# Patient Record
Sex: Male | Born: 1970 | Race: White | Hispanic: No | Marital: Married | Smoking: Never smoker
Health system: Southern US, Community
[De-identification: ages and names within clinical notes are randomized; demographics above are authoritative.]

## PROBLEM LIST (undated history)

## (undated) DIAGNOSIS — G43909 Migraine, unspecified, not intractable, without status migrainosus: Secondary | ICD-10-CM

## (undated) DIAGNOSIS — K635 Polyp of colon: Secondary | ICD-10-CM

## (undated) DIAGNOSIS — K219 Gastro-esophageal reflux disease without esophagitis: Secondary | ICD-10-CM

## (undated) DIAGNOSIS — T7840XA Allergy, unspecified, initial encounter: Secondary | ICD-10-CM

## (undated) HISTORY — DX: Allergy, unspecified, initial encounter: T78.40XA

## (undated) HISTORY — DX: Gastro-esophageal reflux disease without esophagitis: K21.9

## (undated) HISTORY — DX: Polyp of colon: K63.5

## (undated) HISTORY — DX: Migraine, unspecified, not intractable, without status migrainosus: G43.909

## (undated) HISTORY — PX: WISDOM TOOTH EXTRACTION: SHX21

## (undated) HISTORY — PX: ABDOMINAL SURGERY: SHX537

## (undated) HISTORY — PX: HERNIA REPAIR: SHX51

---

## 2003-09-16 HISTORY — PX: NASAL SINUS SURGERY: SHX719

## 2005-09-15 HISTORY — PX: NASAL SEPTUM SURGERY: SHX37

## 2005-12-05 ENCOUNTER — Ambulatory Visit: Payer: Self-pay | Admitting: Internal Medicine

## 2006-05-20 ENCOUNTER — Ambulatory Visit (HOSPITAL_COMMUNITY): Admission: RE | Admit: 2006-05-20 | Discharge: 2006-05-20 | Payer: Self-pay | Admitting: Chiropractic Medicine

## 2007-04-27 ENCOUNTER — Ambulatory Visit: Payer: Self-pay | Admitting: Internal Medicine

## 2007-05-02 ENCOUNTER — Encounter: Admission: RE | Admit: 2007-05-02 | Discharge: 2007-05-02 | Payer: Self-pay | Admitting: Internal Medicine

## 2007-05-04 ENCOUNTER — Telehealth: Payer: Self-pay | Admitting: Internal Medicine

## 2007-05-11 ENCOUNTER — Encounter: Admission: RE | Admit: 2007-05-11 | Discharge: 2007-06-11 | Payer: Self-pay | Admitting: Internal Medicine

## 2007-06-11 ENCOUNTER — Encounter: Payer: Self-pay | Admitting: Internal Medicine

## 2008-12-21 ENCOUNTER — Ambulatory Visit: Payer: Self-pay | Admitting: Sports Medicine

## 2009-03-21 ENCOUNTER — Telehealth: Payer: Self-pay | Admitting: Internal Medicine

## 2009-04-17 ENCOUNTER — Ambulatory Visit: Payer: Self-pay | Admitting: Sports Medicine

## 2009-04-23 ENCOUNTER — Ambulatory Visit: Payer: Self-pay | Admitting: Internal Medicine

## 2009-04-23 LAB — CONVERTED CEMR LAB
ALT: 18 units/L (ref 0–53)
Basophils Absolute: 0 10*3/uL (ref 0.0–0.1)
Basophils Relative: 0.1 % (ref 0.0–3.0)
Calcium: 9.8 mg/dL (ref 8.4–10.5)
Creatinine, Ser: 1.1 mg/dL (ref 0.4–1.5)
Eosinophils Absolute: 0.2 10*3/uL (ref 0.0–0.7)
GFR calc non Af Amer: 80.01 mL/min (ref >60)
HDL: 33.9 mg/dL — ABNORMAL LOW (ref >39.00)
Hemoglobin: 15.5 g/dL (ref 13.0–17.0)
Lymphocytes Relative: 25.9 % (ref 12.0–46.0)
Monocytes Relative: 11.5 % (ref 3.0–12.0)
Neutro Abs: 3.3 10*3/uL (ref 1.4–7.7)
Neutrophils Relative %: 58.5 % (ref 43.0–77.0)
Nitrite: NEGATIVE
Protein, U semiquant: NEGATIVE
RBC: 4.67 M/uL (ref 4.22–5.81)
RDW: 12.2 % (ref 11.5–14.6)
Sodium: 145 meq/L (ref 135–145)
TSH: 1.13 microintl units/mL (ref 0.35–5.50)
Total Bilirubin: 1.4 mg/dL — ABNORMAL HIGH (ref 0.3–1.2)
Total CHOL/HDL Ratio: 4
Total Protein: 7.7 g/dL (ref 6.0–8.3)
Triglycerides: 53 mg/dL (ref 0.0–149.0)
Urobilinogen, UA: 0.2
VLDL: 10.6 mg/dL (ref 0.0–40.0)
WBC Urine, dipstick: NEGATIVE

## 2009-05-07 ENCOUNTER — Telehealth: Payer: Self-pay | Admitting: Internal Medicine

## 2009-05-07 ENCOUNTER — Ambulatory Visit: Payer: Self-pay | Admitting: Internal Medicine

## 2010-03-01 ENCOUNTER — Ambulatory Visit: Payer: Self-pay | Admitting: Internal Medicine

## 2010-07-23 ENCOUNTER — Ambulatory Visit: Payer: Self-pay | Admitting: Internal Medicine

## 2010-07-23 DIAGNOSIS — N320 Bladder-neck obstruction: Secondary | ICD-10-CM | POA: Insufficient documentation

## 2010-07-23 LAB — CONVERTED CEMR LAB: PSA: 0.69 ng/mL (ref 0.10–4.00)

## 2010-10-15 NOTE — Assessment & Plan Note (Signed)
Summary: STREP THROAT / CONGESTION // RS   Vital Signs:  Patient profile:   40 year old male Weight:      153 pounds Temp:     98.7 degrees F oral BP sitting:   110 / 70  (right arm) Cuff size:   regular  Vitals Entered By: Duard Brady LPN (March 01, 2010 12:49 PM) CC: c/o sore throat , on amox from urgent care yesterday - today c/o increased chest congestion  Is Patient Diabetic? No   CC:  c/o sore throat  and on amox from urgent care yesterday - today c/o increased chest congestion .  History of Present Illness: 40 year old patient who is seen today for follow-up.  He was seen at the urgent care yesterday due to sore throat and placed on antibiotic therapy.  A rapid strep screen was apparently normal.  Today.  He developed worsening upper airway congestion and drainage and had a choking spell that was quite frightening for the present time.  He has no respiratory  distress.  Minimal cough.  He is also taken Tylenol, ibuprofen, guaifenesin, and forcing fluids.  Allergies: 1)  ! Aspirin (Aspirin)  Past History:  Past Medical History: Reviewed history from 04/12/2007 and no changes required. sinusitis, chronic  Review of Systems       The patient complains of anorexia, fever, hoarseness, and prolonged cough.  The patient denies weight loss, weight gain, vision loss, decreased hearing, chest pain, syncope, dyspnea on exertion, peripheral edema, headaches, hemoptysis, abdominal pain, melena, hematochezia, severe indigestion/heartburn, hematuria, incontinence, genital sores, muscle weakness, suspicious skin lesions, transient blindness, difficulty walking, depression, unusual weight change, abnormal bleeding, enlarged lymph nodes, angioedema, breast masses, and testicular masses.    Physical Exam  General:  appears healthy, in no acute distress.  Afebrile.  Blood pressure low normal Head:  Normocephalic and atraumatic without obvious abnormalities. No apparent alopecia or  balding. Eyes:  No corneal or conjunctival inflammation noted. EOMI. Perrla. Funduscopic exam benign, without hemorrhages, exudates or papilledema. Vision grossly normal. Ears:  External ear exam shows no significant lesions or deformities.  Otoscopic examination reveals clear canals, tympanic membranes are intact bilaterally without bulging, retraction, inflammation or discharge. Hearing is grossly normal bilaterally. Nose:  External nasal examination shows no deformity or inflammation. Nasal mucosa are pink and moist without lesions or exudates. Mouth:  pharyngeal erythema.  no significant erythematous changes or pharyngeal crowding Neck:  No deformities, masses, or tenderness noted. Lungs:  Normal respiratory effort, chest expands symmetrically. Lungs are clear to auscultation, no crackles or wheezes.  O2 saturation 98% Heart:  Normal rate and regular rhythm. S1 and S2 normal without gallop, murmur, click, rub or other extra sounds.   Impression & Recommendations:  Problem # 1:  PHARYNGITIS-ACUTE (ICD-462)  His updated medication list for this problem includes:    Amoxicillin 875 Mg Tabs (Amoxicillin) ..... Bid  Complete Medication List: 1)  Amoxicillin 875 Mg Tabs (Amoxicillin) .... Bid  Patient Instructions: 1)  Get plenty of rest, drink lots of clear liquids, and use Tylenol or Ibuprofen for fever and comfort. Return in 7-10 days if you're not better:sooner if you're feeling worse.

## 2010-10-15 NOTE — Assessment & Plan Note (Signed)
Summary: CONCERNS WITH BPH (benign prostatic hyperplasia) // RS   Vital Signs:  Patient profile:   40 year old male Weight:      148 pounds BMI:     22.58 Temp:     98.3 degrees F oral Pulse rate:   72 / minute BP sitting:   112 / 64  (left arm) Cuff size:   regular  Vitals Entered By: Alfred Levins, CMA (July 23, 2010 8:42 AM) CC: discuss BPH   CC:  discuss BPH.  History of Present Illness: Wonders about BPH---ongoing for years. patient describes a restricted urinary flow. This is especially noticeable when he has an urge to urinate. Once in a while he'll note urinary hesitancy. He does notice a poor stream. Also has nocturia 1-2 times per night. Duration has been ongoing for many years perhaps progressive in the last few months. He hasn't noted any significant change with caffeine or over-the-counter sinus medications.  No other complaints.  Current Problems (verified): None  Current Medications (verified): 1)  Flonase 50 Mcg/act Susp (Fluticasone Propionate) .... 2 Sprays Each Nostril As Needed 2)  Diflucan 100 Mg Tabs (Fluconazole) .... Once Daily One Week On and 3 Weeks Off  Allergies (verified): 1)  ! 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 HEENT exam atraumatic, normocephalic it is worth noting that he has a 2 cm swelling between his eyebrows. Neck is supple. Chest clear auscultation cardiac  exam S1-S2 are regular. Prostate exam rectal tone is normal. Prostate is flat and small without nodules or asymmetry.   Impression & Recommendations:  Problem # 1:  BLADDER OUTLET OBSTRUCTION (ICD-596.0)  patient describes bladder neck obstruction. This could be prostate related. I think it's worth trying to treat with tamsulosin. We'll start with 0.4 mg p.o. q.h.s. Side effects discussed. If his symptoms don't resolve he is to call me and at have him see urology. Check labs today.  Orders: UA Dipstick w/o Micro (automated)  (81003) Venipuncture (16109) TLB-PSA (Prostate Specific Antigen) (84153-PSA) in regards to the swelling between has eyebrows I think this is more likely some sort of allergic reaction perhaps an insect bite. He'll call me if it worsens or doesn't resolve.  Complete Medication List: 1)  Flonase 50 Mcg/act Susp (Fluticasone propionate) .... 2 sprays each nostril as needed 2)  Diflucan 100 Mg Tabs (Fluconazole) .... Once daily one week on and 3 weeks off 3)  Tamsulosin Hcl 0.4 Mg Caps (Tamsulosin hcl) .... Take 1 tab by mouth at bedtime Prescriptions: TAMSULOSIN HCL 0.4 MG CAPS (TAMSULOSIN HCL) Take 1 tab by mouth at bedtime  #30 x 11   Entered and Authorized by:   Birdie Sons MD   Signed by:   Birdie Sons MD on 07/23/2010   Method used:   Electronically to        [facility] [address] (retail)       [address]       [place], [place]  [address]  Ph: [phone]       Fax: [phone]   RxID:   [id number]    Orders Added: 1)  Est. Patient Level III [62831] 2)  UA Dipstick w/o Micro (automated)  [81003] 3)  Venipuncture [51761] 4)  TLB-PSA (Prostate Specific Antigen) [60737-TGG]  Appended Document: Orders Update    Clinical Lists Changes  Orders: Added new Service order of Specimen Handling (26948) - Signed      Appended Document: CONCERNS WITH BPH (benign prostatic hyperplasia) //  RS  Laboratory Results   Urine Tests    Routine Urinalysis   Color: yellow Appearance: Clear Glucose: negative   (Normal Range: Negative) Bilirubin: 1+   (Normal Range: Negative) Ketone: negative   (Normal Range: Negative) Spec. Gravity: >=1.030   (Normal Range: 1.003-1.035) Blood: trace-lysed   (Normal Range: Negative) pH: 5.5   (Normal Range: 5.0-8.0) Protein: negative   (Normal Range: Negative) Urobilinogen: 0.2   (Normal Range: 0-1) Nitrite: negative   (Normal Range: Negative) Leukocyte Esterace: negative   (Normal Range: Negative)    Comments: Rita Ohara  July 23, 2010 3:16 PM

## 2010-11-19 ENCOUNTER — Other Ambulatory Visit: Payer: Self-pay | Admitting: Dermatology

## 2012-11-17 ENCOUNTER — Ambulatory Visit (INDEPENDENT_AMBULATORY_CARE_PROVIDER_SITE_OTHER): Payer: Managed Care, Other (non HMO) | Admitting: Family Medicine

## 2012-11-17 VITALS — BP 106/74 | Ht 69.0 in | Wt 145.0 lb

## 2012-11-17 DIAGNOSIS — M25561 Pain in right knee: Secondary | ICD-10-CM

## 2012-11-17 DIAGNOSIS — M25569 Pain in unspecified knee: Secondary | ICD-10-CM

## 2012-11-17 MED ORDER — NITROGLYCERIN 0.1 MG/HR TD PT24: MEDICATED_PATCH | TRANSDERMAL | Status: DC

## 2012-11-17 NOTE — Patient Instructions (Addendum)
Nitroglycerin Protocol   Apply 1/4 nitroglycerin patch to affected area daily.  Change position of patch within the affected area every 24 hours.  You may experience a headache during the first 1-2 weeks of using the patch, these should subside.  If you experience headaches after beginning nitroglycerin patch treatment, you may take your preferred over the counter pain reliever.  Another side effect of the nitroglycerin patch is skin irritation or rash related to patch adhesive.  Please notify our office if you develop more severe headaches or rash, and stop the patch.  Tendon healing with nitroglycerin patch may require 12 to 24 weeks depending on the extent of injury.  Men should not use if taking Viagra, Cialis, or Levitra.   Do not use if you have migraines or rosacea.   Compression.   Quad strengthening exercises: Knee bends (70 deg) 30 times.   Try videotaping while biking to see what your knees do.   Follow-up in 4-6 weeks.

## 2012-11-17 NOTE — Assessment & Plan Note (Signed)
He may have partial tear of attachment of right VMO. Also evidence of left VMO tear although he does not have any pain in left knee.  -NTG protocol -Quad eccentric strengthening exercises -NSAIDs/OTC analgesics prn -Knee compression -Try cycling in about 2 weeks and then gradually as tolerated -Follow-up 4-6 weeks -We discussed getting MRI. Since we will treat as VMO tear, we will defer MRI at this time. If patient changes mind or if he has persistent pain at follow-up, may consider at that time.

## 2012-11-17 NOTE — Progress Notes (Signed)
  Subjective:    Patient ID: Stephen [name], male    DOB: [birth year], 42 y.o.   MRN: [id number]  HPI CC: left knee pain while cycling  This is a 41 YOM who participates in sprint triathlons. Since September, about 30-45 minutes into his cycling, he started experiencing excruciating medial superior right knee pain. The pain resolved immediately after he stopped cycling. He denies residual swelling. The pain is worse when he is at maximum flexion and just about to pedal down.  He denies pain while running.  He took a 2-3 Christoffer Currier break recently due to having the flu but the pain remained unchanged when he started cycling again.   He had his bike fitted to him and he tried changing his pedals, however, this made no difference in his symptoms.   He denies history of previous knee injuries, changes in activities/training with the onset of symptoms  Review of Systems  Per HPI  Allergies, medication, past medical history reviewed.  Smoking status noted.      Objective:   Physical Exam GEN: NAD; appears healthy and in shape GAIT: normal heel-toe pattern RIGHT KNEE:    Appearance: normal, no swelling/bruising, no VMO atrophy; quads appear normal   Tenderness: none, including medial/lateral joint line, patellar tendon   Maneuvers:     Patellar grind negative     McMurray's negative     No crepitus   Strength: 5/5 knee flexion/extension HIP: abduction/IR/ER: WNL   Symmetric leg lengths FEET: arches intacct   Limited MSK ultrasound RIGHT KNEE Quads: VMO with hypoechoic area and increased Doppler flow   Similar area of hypoechogenicity of left VMO attachment Patellar tendon intact Medial and lateral meniscus intact    Assessment & Plan:

## 2012-11-17 NOTE — Progress Notes (Deleted)
Stephen [name] is a 42 y.o. male who presents to [facility] today for right knee pain present for the last 6 months. Patient notes pain at the superior medial aspect of his right patella only with cycling. He is an avid triathlete currently doing sprint distance triathlons.  He notes pain typically occurring 30-40 minutes into bike rides at the superior medial aspect of his patella. He is pain-free with normal activities of daily living and running. He denies any swelling locking catching radiating pain weakness or numbness. He denies any change in equipment or injury. He has a proper bike set up by a bike set up by a bike fitting specialist.    PMH reviewed. Healthy History  Substance Use Topics  . Smoking status: Not on file  . Smokeless tobacco: Not on file  . 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 Normal-appearing nontender Range of motion 0-130  Stable ligamentous exam Strength intact to extension and flexion.   Pulses capillary refill and sensation are intact distally  Limited musculoskeletal ultrasound right knee: Longitudinal assessment of the VMO insertion onto the patella shows a small area of hypoechoic change within the insertion of portion of the tendon. This is associated with increased Doppler activity and seen in both longitudinal and transverse views.  This is consistent with tendinosis or small tear.

## 2012-12-02 ENCOUNTER — Ambulatory Visit: Payer: Self-pay | Admitting: Sports Medicine

## 2012-12-27 NOTE — Addendum Note (Signed)
Addended by: Annita Brod on: 12/27/2012 10:22 AM   Modules accepted: Orders

## 2012-12-29 ENCOUNTER — Other Ambulatory Visit: Payer: Managed Care, Other (non HMO)

## 2012-12-31 ENCOUNTER — Ambulatory Visit
Admission: RE | Admit: 2012-12-31 | Discharge: 2012-12-31 | Disposition: A | Discharge status: Home / Self Care | Payer: No Typology Code available for payment source | Source: Ambulatory Visit | Attending: Family Medicine | Admitting: Family Medicine

## 2012-12-31 DIAGNOSIS — M25561 Pain in right knee: Secondary | ICD-10-CM

## 2013-01-12 ENCOUNTER — Ambulatory Visit: Payer: Self-pay | Admitting: Family Medicine

## 2013-04-27 ENCOUNTER — Encounter: Payer: Self-pay | Admitting: Family Medicine

## 2013-04-27 ENCOUNTER — Ambulatory Visit (INDEPENDENT_AMBULATORY_CARE_PROVIDER_SITE_OTHER): Payer: 59 | Admitting: Family Medicine

## 2013-04-27 ENCOUNTER — Ambulatory Visit: Payer: Self-pay | Admitting: Family Medicine

## 2013-04-27 VITALS — BP 108/68 | HR 74 | Temp 98.2°F | Wt 145.0 lb

## 2013-04-27 DIAGNOSIS — J029 Acute pharyngitis, unspecified: Secondary | ICD-10-CM

## 2013-04-27 MED ORDER — AMOXICILLIN 875 MG PO TABS: 875.0000 mg | ORAL_TABLET | Freq: Two times a day (BID) | ORAL | Status: DC

## 2013-04-27 NOTE — Patient Instructions (Addendum)
Strep Throat  Strep throat is an infection of the throat caused by a bacteria named Streptococcus pyogenes. Your caregiver may call the infection streptococcal "tonsillitis" or "pharyngitis" depending on whether there are signs of inflammation in the tonsils or back of the throat. Strep throat is most common in children aged 41 15 years during the cold months of the year, but it can occur in people of any age during any season. This infection is spread from person to person (contagious) through coughing, sneezing, or other close contact.  SYMPTOMS   · Fever or chills.  · Painful, swollen, red tonsils or throat.  · Pain or difficulty when swallowing.  · White or yellow spots on the tonsils or throat.  · Swollen, tender lymph nodes or "glands" of the neck or under the jaw.  · Red rash all over the body (rare).  DIAGNOSIS   Many different infections can cause the same symptoms. A test must be done to confirm the diagnosis so the right treatment can be given. A "rapid strep test" can help your caregiver make the diagnosis in a few minutes. If this test is not available, a light swab of the infected area can be used for a throat culture test. If a throat culture test is done, results are usually available in a day or two.  TREATMENT   Strep throat is treated with antibiotic medicine.  HOME CARE INSTRUCTIONS   · Gargle with 1 tsp of salt in 1 cup of warm water, 3 4 times per day or as needed for comfort.  · Family members who also have a sore throat or fever should be tested for strep throat and treated with antibiotics if they have the strep infection.  · Make sure everyone in your household washes their hands well.  · Do not share food, drinking cups, or personal items that could cause the infection to spread to others.  · You may need to eat a soft food diet until your sore throat gets better.  · Drink enough water and fluids to keep your urine clear or pale yellow. This will help prevent dehydration.  · Get plenty of  rest.  · Stay home from school, daycare, or work until you have been on antibiotics for 24 hours.  · Only take over-the-counter or prescription medicines for pain, discomfort, or fever as directed by your caregiver.  · If antibiotics are prescribed, take them as directed. Finish them even if you start to feel better.  SEEK MEDICAL CARE IF:   · The glands in your neck continue to enlarge.  · You develop a rash, cough, or earache.  · You cough up green, yellow-brown, or bloody sputum.  · You have pain or discomfort not controlled by medicines.  · Your problems seem to be getting worse rather than better.  SEEK IMMEDIATE MEDICAL CARE IF:   · You develop any new symptoms such as vomiting, severe headache, stiff or painful neck, chest pain, shortness of breath, or trouble swallowing.  · You develop severe throat pain, drooling, or changes in your voice.  · You develop swelling of the neck, or the skin on the neck becomes red and tender.  · You have a fever.  · You develop signs of dehydration, such as fatigue, dry mouth, and decreased urination.  · You become increasingly sleepy, or you cannot wake up completely.  Document Released: 08/29/2000 Document Revised: 08/18/2012 Document Reviewed: 10/31/2010  ExitCare® Patient Information ©2014 [employer].

## 2013-04-27 NOTE — Progress Notes (Signed)
  Subjective:    Patient ID: Stephen [name], male    DOB: 05-28-1971, 42 y.o.   MRN: [id number]  HPI Acute visit Three-day history of sore throat.  He's had some chills and night sweats and possible fever Denies any nasal congestion or cough. Advil has provided some basic relief. He's had some increased malaise and body aches No sick contacts but he has traveled some recently Symptoms are actually somewhat improved today. He did notice some exudate right tonsillar region and right side is worse than left  No past medical history on file. No past surgical history on file.  reports that he has never smoked. He does not have any smokeless tobacco history on file. He reports that  drinks alcohol. He reports that he does not use illicit drugs. family history is not on file. Allergies  Allergen Reactions  . Aspirin     REACTION: hives      Review of Systems  Constitutional: Positive for chills and fatigue.  HENT: Positive for sore throat. Negative for ear pain, congestion and postnasal drip.   Respiratory: Negative for cough.   Hematological: Negative for adenopathy.       Objective:   Physical Exam  Constitutional: He appears well-developed and well-nourished.  HENT:  Right Ear: External ear normal.  Left Ear: External ear normal.  Posterior pharynx reveals erythema right side greater than left. He has some whitish exudate right tonsillar region. No evidence for peritonsillar abscess  Neck: Neck supple.  Cardiovascular: Normal rate and regular rhythm.   Pulmonary/Chest: Effort normal and breath sounds normal. No respiratory distress. He has no wheezes. He has no rales.  Lymphadenopathy:    He has no cervical adenopathy.          Assessment & Plan:  Acute pharyngitis. Rule out group A strep. Rapid strep obtained Rapid strep negative. Given lack of nasal congestion and cough and typical viral symptoms go ahead and cover with amoxicillin 875 mg twice daily for 10 days. We  discussed pros and cons of throat culture and have decided to wait

## 2013-06-28 ENCOUNTER — Ambulatory Visit: Payer: 59

## 2013-07-08 ENCOUNTER — Ambulatory Visit (INDEPENDENT_AMBULATORY_CARE_PROVIDER_SITE_OTHER): Payer: 59

## 2013-07-08 DIAGNOSIS — Z23 Encounter for immunization: Secondary | ICD-10-CM

## 2013-09-15 HISTORY — PX: UMBILICAL HERNIA REPAIR: SHX196

## 2013-09-15 HISTORY — PX: EXPLORATORY LAPAROTOMY: SUR591

## 2013-09-15 HISTORY — PX: ABDOMINAL SURGERY: SHX537

## 2014-01-03 ENCOUNTER — Ambulatory Visit (INDEPENDENT_AMBULATORY_CARE_PROVIDER_SITE_OTHER): Payer: Managed Care, Other (non HMO) | Admitting: Sports Medicine

## 2014-01-03 ENCOUNTER — Encounter: Payer: Self-pay | Admitting: Sports Medicine

## 2014-01-03 VITALS — BP 111/75 | HR 63 | Ht 69.0 in | Wt 145.0 lb

## 2014-01-03 DIAGNOSIS — M67912 Unspecified disorder of synovium and tendon, left shoulder: Secondary | ICD-10-CM

## 2014-01-03 DIAGNOSIS — M6790 Unspecified disorder of synovium and tendon, unspecified site: Secondary | ICD-10-CM

## 2014-01-03 DIAGNOSIS — M719 Bursopathy, unspecified: Secondary | ICD-10-CM

## 2014-01-03 MED ORDER — NITROGLYCERIN 0.2 MG/HR TD PT24: MEDICATED_PATCH | TRANSDERMAL | Status: DC

## 2014-01-03 NOTE — Patient Instructions (Signed)
Please start applying 1/4 of a nitroglycerin patch to left shoulder daily  Nitroglycerin Protocol   Apply 1/4 nitroglycerin patch to affected area daily.  Change position of patch within the affected area every 24 hours.  You may experience a headache during the first 1-2 weeks of using the patch, these should subside.  If you experience headaches after beginning nitroglycerin patch treatment, you may take your preferred over the counter pain reliever.  Another side effect of the nitroglycerin patch is skin irritation or rash related to patch adhesive.  Please notify our office if you develop more severe headaches or rash, and stop the patch.  Tendon healing with nitroglycerin patch may require 12 to 24 weeks depending on the extent of injury.  Men should not use if taking Viagra, Cialis, or Levitra.   Do not use if you have migraines or rosacea.   Please do suggested exercises for your shoulder daily  Follow up in 4 weeks  Thank you for seeing [place] today!

## 2014-01-03 NOTE — Assessment & Plan Note (Signed)
Given standard rotator cuff rehabilitation for home exercise program  Okay to continue lifting and other planes of motion that do not cause pain  Nitroglycerin protocol  Recheck 4-6 weeks

## 2014-01-03 NOTE — Progress Notes (Signed)
Patient ID: Stephen [name], male   DOB: 08/13/1971, 43 y.o.   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 Palpation is normal with no tenderness over AC joint or bicipital groove. ROM is full in all planes. Rotator cuff strength normal throughout with exception of supraspinatous weakness on the left Positive signs of impingement with  Neer and Hawkin's tests, empty can. Speeds and Yergason's tests normal. Very slight pain on speeds Equivocal labral pathology noted with  Obrien's feeling weak on both palm up and palm down position  negative clunk and good stability. Normal scapular function observed. No painful arc and no drop arm sign. No apprehension sign  MSK ultrasound Normal biceps tendon Normal subscapularis infraspinatus and teres minor tendons In the supraspinatous only in the posterior third of fibers there is a 0.5 cm hypoechoic gap  Normal a.c. Joint Posterior labrum looks normal

## 2014-02-01 ENCOUNTER — Ambulatory Visit: Payer: Managed Care, Other (non HMO) | Admitting: Sports Medicine

## 2014-02-21 ENCOUNTER — Ambulatory Visit (INDEPENDENT_AMBULATORY_CARE_PROVIDER_SITE_OTHER): Payer: BC Managed Care – PPO | Admitting: Sports Medicine

## 2014-02-21 ENCOUNTER — Encounter: Payer: Self-pay | Admitting: Sports Medicine

## 2014-02-21 VITALS — BP 99/62 | Ht 69.0 in | Wt 147.0 lb

## 2014-02-21 DIAGNOSIS — M67912 Unspecified disorder of synovium and tendon, left shoulder: Secondary | ICD-10-CM

## 2014-02-21 DIAGNOSIS — M6790 Unspecified disorder of synovium and tendon, unspecified site: Secondary | ICD-10-CM

## 2014-02-21 DIAGNOSIS — M719 Bursopathy, unspecified: Secondary | ICD-10-CM

## 2014-02-21 MED ORDER — NITROGLYCERIN 0.2 MG/HR TD PT24: MEDICATED_PATCH | TRANSDERMAL | Status: DC

## 2014-02-21 NOTE — Assessment & Plan Note (Signed)
Keep up NTG  Keep working rehab   Build up to 5 lbs  Reck 6 wks and will hopefully be ready to stop NTG

## 2014-02-21 NOTE — Patient Instructions (Signed)
I will refill your nitroglycerin. Continue exercises. Do not go above 5 lbs. Gradually increase the weight and positions for strengthening. You can go to half-pushups and advance as tolerated. Follow up with Dr Oneida Alar in 6 weeks for re-evaluation. You can swim and do other exercises as pain allows. Maximum pain should be no greater than 3/10.

## 2014-02-21 NOTE — Progress Notes (Signed)
Patient ID: Stephen [name], male   DOB: 01-03-1971, 43 y.o.   MRN: [id number]  Patient w left shoulder pain ER of shoulder while yawning and sleeping Felt pop and pain on anterior capsule  Now on NTG and HEP Able to do more exercises and using 3 lb now No more night pain  No new sxs  Comes for reck  NAD BP 99/62  Ht 5\' 9"  (1.753 m)  Wt 147 lb (66.679 kg)  BMI 21.70 kg/m2 Left Shoulder: Inspection reveals no abnormalities, atrophy or asymmetry. Palpation is normal with no tenderness over AC joint or bicipital groove. ROM is full in all planes. Rotator cuff strength normal throughout. No signs of impingement with negative Neer and Hawkin's tests, empty can. Speeds and Yergason's tests normal. No labral pathology noted with negative Obrien's, negative clunk and good stability. Normal scapular function observed. No painful arc and no drop arm sign. No apprehension sign  Clunk test was neg  Gets pain on elevated IR resistance SLT pain over biceps with speeds but no palpable TTP

## 2014-04-04 ENCOUNTER — Ambulatory Visit (INDEPENDENT_AMBULATORY_CARE_PROVIDER_SITE_OTHER): Payer: BC Managed Care – PPO | Admitting: Sports Medicine

## 2014-04-04 ENCOUNTER — Encounter: Payer: Self-pay | Admitting: Sports Medicine

## 2014-04-04 VITALS — BP 116/72 | HR 63 | Ht 69.0 in | Wt 147.0 lb

## 2014-04-04 DIAGNOSIS — M719 Bursopathy, unspecified: Secondary | ICD-10-CM

## 2014-04-04 DIAGNOSIS — M67912 Unspecified disorder of synovium and tendon, left shoulder: Secondary | ICD-10-CM

## 2014-04-04 DIAGNOSIS — M6790 Unspecified disorder of synovium and tendon, unspecified site: Secondary | ICD-10-CM

## 2014-04-04 NOTE — Progress Notes (Signed)
Patient ID: Stephen [name], male   DOB: 22-May-1971, 43 y.o.   MRN: [id number]  Stephen [name] returns for followup of left rotator cuff tendinopathy He has used the nitroglycerin protocol for 3 months He was much better at his 6 week visit Now he states he rarely has any pain. He is able to do pushups again. He has no night pain. Pain level usually is 0 and does not go above 3.  He didn't last long including swimming this last weekend with no pain. He has been very consistent with a home exercise program.  Physical exam Muscular male in no acute distress BP 116/72  Pulse 63  Ht 5\' 9"  (1.753 m)  Wt 147 lb (66.679 kg)  BMI 21.70 kg/m2   Shoulder: Inspection reveals no abnormalities, atrophy or asymmetry. Palpation is normal with no tenderness over AC joint or bicipital groove. ROM is full in all planes. Rotator cuff strength normal throughout. No signs of impingement with negative Neer and Hawkin's tests, empty can. Speeds and Yergason's tests normal. No labral pathology noted with negative Obrien's, negative clunk and good stability. Normal scapular function observed. No painful arc and no drop arm sign. No apprehension sign  Provocative tests with full resistance do not cause any pain  MSK ultrasound Bicipital tendon is normal Subscapularis infraspinatus and teres minor are normal Supraspinatous shows that the small half centimeter tear has healed and there is only a minimal amount of hypoechoic change within the tendon itself

## 2014-04-04 NOTE — Assessment & Plan Note (Signed)
Clinically this has resolved On ultrasound his tear has resolved and there is only a minimal amount of hypoechoic change within the supraspinatous tendon  He will continue home exercises over the next month while he increases back to his normal training schedule Wean off nitroglycerin  Return when necessary

## 2014-05-25 ENCOUNTER — Emergency Department (HOSPITAL_COMMUNITY): Payer: BLUE CROSS/BLUE SHIELD

## 2014-05-25 ENCOUNTER — Observation Stay (HOSPITAL_COMMUNITY)
Admission: EM | Admit: 2014-05-25 | Discharge: 2014-05-26 | Disposition: A | Discharge status: Home / Self Care | Payer: BLUE CROSS/BLUE SHIELD | Attending: Surgery | Admitting: Surgery

## 2014-05-25 ENCOUNTER — Encounter (HOSPITAL_COMMUNITY): Payer: Self-pay | Admitting: Emergency Medicine

## 2014-05-25 DIAGNOSIS — R109 Unspecified abdominal pain: Secondary | ICD-10-CM | POA: Diagnosis present

## 2014-05-25 DIAGNOSIS — Z9889 Other specified postprocedural states: Secondary | ICD-10-CM | POA: Insufficient documentation

## 2014-05-25 DIAGNOSIS — R1013 Epigastric pain: Principal | ICD-10-CM | POA: Insufficient documentation

## 2014-05-25 LAB — URINALYSIS, ROUTINE W REFLEX MICROSCOPIC
BILIRUBIN URINE: NEGATIVE
Glucose, UA: NEGATIVE mg/dL
Hgb urine dipstick: NEGATIVE
KETONES UR: NEGATIVE mg/dL
Leukocytes, UA: NEGATIVE
NITRITE: NEGATIVE
PH: 7.5 (ref 5.0–8.0)
PROTEIN: NEGATIVE mg/dL
Specific Gravity, Urine: 1.008 (ref 1.005–1.030)
Urobilinogen, UA: 0.2 mg/dL (ref 0.0–1.0)

## 2014-05-25 LAB — LIPASE, BLOOD: LIPASE: 65 U/L — AB (ref 11–59)

## 2014-05-25 LAB — CBC WITH DIFFERENTIAL/PLATELET
BASOS ABS: 0 10*3/uL (ref 0.0–0.1)
Basophils Relative: 0 % (ref 0–1)
EOS PCT: 1 % (ref 0–5)
Eosinophils Absolute: 0.1 10*3/uL (ref 0.0–0.7)
HEMATOCRIT: 44.2 % (ref 39.0–52.0)
HEMOGLOBIN: 15.5 g/dL (ref 13.0–17.0)
LYMPHS ABS: 1.2 10*3/uL (ref 0.7–4.0)
LYMPHS PCT: 10 % — AB (ref 12–46)
MCH: 31.8 pg (ref 26.0–34.0)
MCHC: 35.1 g/dL (ref 30.0–36.0)
MCV: 90.8 fL (ref 78.0–100.0)
MONO ABS: 0.9 10*3/uL (ref 0.1–1.0)
MONOS PCT: 8 % (ref 3–12)
NEUTROS ABS: 9.9 10*3/uL — AB (ref 1.7–7.7)
Neutrophils Relative %: 81 % — ABNORMAL HIGH (ref 43–77)
Platelets: 247 10*3/uL (ref 150–400)
RBC: 4.87 MIL/uL (ref 4.22–5.81)
RDW: 12.5 % (ref 11.5–15.5)
WBC: 12 10*3/uL — AB (ref 4.0–10.5)

## 2014-05-25 LAB — COMPREHENSIVE METABOLIC PANEL
ALT: 18 U/L (ref 0–53)
AST: 21 U/L (ref 0–37)
Albumin: 4.6 g/dL (ref 3.5–5.2)
Alkaline Phosphatase: 37 U/L — ABNORMAL LOW (ref 39–117)
Anion gap: 16 — ABNORMAL HIGH (ref 5–15)
BILIRUBIN TOTAL: 0.7 mg/dL (ref 0.3–1.2)
BUN: 16 mg/dL (ref 6–23)
CALCIUM: 10.2 mg/dL (ref 8.4–10.5)
CHLORIDE: 100 meq/L (ref 96–112)
CO2: 25 meq/L (ref 19–32)
CREATININE: 1 mg/dL (ref 0.50–1.35)
GLUCOSE: 109 mg/dL — AB (ref 70–99)
Potassium: 3.7 mEq/L (ref 3.7–5.3)
Sodium: 141 mEq/L (ref 137–147)
Total Protein: 7.2 g/dL (ref 6.0–8.3)

## 2014-05-25 MED ORDER — ENOXAPARIN SODIUM 40 MG/0.4ML ~~LOC~~ SOLN
40.0000 mg | SUBCUTANEOUS | Status: DC
  Administered 2014-05-25: 40 mg via SUBCUTANEOUS
  Filled 2014-05-25 (×2): qty 0.4

## 2014-05-25 MED ORDER — DIPHENHYDRAMINE HCL 50 MG/ML IJ SOLN
50.0000 mg | Freq: Once | INTRAMUSCULAR | Status: AC
  Administered 2014-05-25: 50 mg via INTRAVENOUS
  Filled 2014-05-25: qty 1

## 2014-05-25 MED ORDER — MORPHINE SULFATE 4 MG/ML IJ SOLN
4.0000 mg | INTRAMUSCULAR | Status: DC | PRN
  Administered 2014-05-25 (×2): 4 mg via INTRAVENOUS
  Filled 2014-05-25 (×2): qty 1

## 2014-05-25 MED ORDER — ZOLPIDEM TARTRATE 5 MG PO TABS: 5.0000 mg | ORAL_TABLET | Freq: Every evening | ORAL | Status: DC | PRN

## 2014-05-25 MED ORDER — ONDANSETRON HCL 4 MG/2ML IJ SOLN: 4.0000 mg | Freq: Four times a day (QID) | INTRAMUSCULAR | Status: DC | PRN

## 2014-05-25 MED ORDER — DEXTROSE-NACL 5-0.9 % IV SOLN
INTRAVENOUS | Status: DC
  Administered 2014-05-25 – 2014-05-26 (×5): via INTRAVENOUS

## 2014-05-25 MED ORDER — SODIUM CHLORIDE 0.9 % IV BOLUS (SEPSIS)
1000.0000 mL | Freq: Once | INTRAVENOUS | Status: AC
  Administered 2014-05-25: 1000 mL via INTRAVENOUS

## 2014-05-25 MED ORDER — PANTOPRAZOLE SODIUM 40 MG IV SOLR
40.0000 mg | Freq: Every day | INTRAVENOUS | Status: DC
  Administered 2014-05-25: 40 mg via INTRAVENOUS
  Filled 2014-05-25 (×2): qty 40

## 2014-05-25 MED ORDER — IOHEXOL 300 MG/ML  SOLN
80.0000 mL | Freq: Once | INTRAMUSCULAR | Status: AC | PRN
  Administered 2014-05-25: 80 mL via INTRAVENOUS

## 2014-05-25 MED ORDER — MORPHINE SULFATE 2 MG/ML IJ SOLN: 2.0000 mg | INTRAMUSCULAR | Status: DC | PRN

## 2014-05-25 MED ORDER — ONDANSETRON HCL 4 MG/2ML IJ SOLN
4.0000 mg | Freq: Once | INTRAMUSCULAR | Status: AC
  Administered 2014-05-25: 4 mg via INTRAVENOUS
  Filled 2014-05-25: qty 2

## 2014-05-25 NOTE — ED Provider Notes (Signed)
Medical screening examination/treatment/procedure(s) were conducted as a shared visit with non-physician practitioner(s) and myself.  I personally evaluated the patient during the encounter.   EKG Interpretation   Date/Time:  Thursday May 25 2014 13:11:39 EDT Ventricular Rate:  65 PR Interval:  168 QRS Duration: 98 QT Interval:  413 QTC Calculation: 429 R Axis:   61 Text Interpretation:  Sinus rhythm RSR' in V1 or V2, probably normal  variant ST elev, probable normal early repol pattern No old tracing to  compare Confirmed by [facility]  MD, TREY ([id number]) on 05/26/2014 7:50:06 PM      43 yo male with abdominal pain.  Recent laparotomy for SBO.  On exam, well appearing, nontoxic, not distressed, normal respiratory effort, normal perfusion, abdomen soft with mild right sided tenderness without r/r/g.  Abdominal surgical scar healing well.  CT shows inflammatory stranding around jejunum.  Surgery will admit.    Clinical Impression: 1. Epigastric pain       Houston Siren III, MD 05/26/14 1950

## 2014-05-25 NOTE — H&P (Signed)
Chief Complaint:  Abdominal pain HPI: Stephen [name] is a healthy 43 year old male who was vacationing in  2.5 weeks ago who developed abdominal pain.  He was found to have an internal hernia causing an obstruction.  He underwent an exploratory laparotomy with LOA, hernia repair.  He reports having a normal post operative course is being discharged home.  He reports since his surgery having episodes of severe abdominal pain when he eats anything other than chicken noodle soup.  After having chicken, rice and vegetables last night he developed severe abdominal pain in the middle of the night once again.  He has been taking motrin and tylenol, vicodin at times, but did not have any relief in his symptoms.  He denies nausea or vomiting.  Denies fever, chills or sweats.  He reports intermittent GERD like symptoms and takes a prilosec OTC. Aggravated by food.  No alleviating factors.  Modifying factors as above.  Pain is characterized as dull, burning type pain.  He has normal BMs, no melena or hematochezia.  His work up shows a white count of 12K, normal electrolytes.   A CT of abdomen and pelvis shows small bowel wall thickening with stranding, fecalization of small bowel and small volume free fluid within the pelvis.  We have therefore been asked to evaluate the patient.    History reviewed. No pertinent past medical history.  Past Surgical History  Procedure Laterality Date  . Abdominal surgery    . Hernia repair      History reviewed. No pertinent family history. Social History:  reports that he has never smoked. He does not have any smokeless tobacco history on file. He reports that he drinks alcohol. He reports that he does not use illicit drugs.  Allergies:  Allergies  Allergen Reactions  . Aspirin Hives   Medication prilosec OTC daily  (Not in a hospital admission)  Results for orders placed during the hospital encounter of 05/25/14 (from the past 48 hour(s))  CBC WITH DIFFERENTIAL      Status: Abnormal   Collection Time    05/25/14 12:30 PM      Result Value Ref Range   WBC 12.0 (*) 4.0 - 10.5 K/uL   RBC 4.87  4.22 - 5.81 MIL/uL   Hemoglobin 15.5  13.0 - 17.0 g/dL   HCT 44.2  39.0 - 52.0 %   MCV 90.8  78.0 - 100.0 fL   MCH 31.8  26.0 - 34.0 pg   MCHC 35.1  30.0 - 36.0 g/dL   RDW 12.5  11.5 - 15.5 %   Platelets 247  150 - 400 K/uL   Neutrophils Relative % 81 (*) 43 - 77 %   Neutro Abs 9.9 (*) 1.7 - 7.7 K/uL   Lymphocytes Relative 10 (*) 12 - 46 %   Lymphs Abs 1.2  0.7 - 4.0 K/uL   Monocytes Relative 8  3 - 12 %   Monocytes Absolute 0.9  0.1 - 1.0 K/uL   Eosinophils Relative 1  0 - 5 %   Eosinophils Absolute 0.1  0.0 - 0.7 K/uL   Basophils Relative 0  0 - 1 %   Basophils Absolute 0.0  0.0 - 0.1 K/uL  COMPREHENSIVE METABOLIC PANEL     Status: Abnormal   Collection Time    05/25/14 12:30 PM      Result Value Ref Range   Sodium 141  137 - 147 mEq/L   Potassium 3.7  3.7 - 5.3 mEq/L  Chloride 100  96 - 112 mEq/L   CO2 25  19 - 32 mEq/L   Glucose, Bld 109 (*) 70 - 99 mg/dL   BUN 16  6 - 23 mg/dL   Creatinine, Ser 1.00  0.50 - 1.35 mg/dL   Calcium 10.2  8.4 - 10.5 mg/dL   Total Protein 7.2  6.0 - 8.3 g/dL   Albumin 4.6  3.5 - 5.2 g/dL   AST 21  0 - 37 U/L   ALT 18  0 - 53 U/L   Alkaline Phosphatase 37 (*) 39 - 117 U/L   Total Bilirubin 0.7  0.3 - 1.2 mg/dL   GFR calc non Af Amer >90  >90 mL/min   GFR calc Af Amer >90  >90 mL/min   Comment: (NOTE)     The eGFR has been calculated using the CKD EPI equation.     This calculation has not been validated in all clinical situations.     eGFR's persistently <90 mL/min signify possible Chronic Kidney     Disease.   Anion gap 16 (*) 5 - 15  LIPASE, BLOOD     Status: Abnormal   Collection Time    05/25/14 12:30 PM      Result Value Ref Range   Lipase 65 (*) 11 - 59 U/L  URINALYSIS, ROUTINE W REFLEX MICROSCOPIC     Status: None   Collection Time    05/25/14  2:15 PM      Result Value Ref Range   Color, Urine  YELLOW  YELLOW   APPearance CLEAR  CLEAR   Specific Gravity, Urine 1.008  1.005 - 1.030   pH 7.5  5.0 - 8.0   Glucose, UA NEGATIVE  NEGATIVE mg/dL   Hgb urine dipstick NEGATIVE  NEGATIVE   Bilirubin Urine NEGATIVE  NEGATIVE   Ketones, ur NEGATIVE  NEGATIVE mg/dL   Protein, ur NEGATIVE  NEGATIVE mg/dL   Urobilinogen, UA 0.2  0.0 - 1.0 mg/dL   Nitrite NEGATIVE  NEGATIVE   Leukocytes, UA NEGATIVE  NEGATIVE   Comment: MICROSCOPIC NOT DONE ON URINES WITH NEGATIVE PROTEIN, BLOOD, LEUKOCYTES, NITRITE, OR GLUCOSE <1000 mg/dL.   Ct Abdomen Pelvis W Contrast  05/25/2014   CLINICAL DATA:  Abdominal pain, recent bowel obstruction due to hernia.  EXAM: CT ABDOMEN AND PELVIS WITH CONTRAST  TECHNIQUE: Multidetector CT imaging of the abdomen and pelvis was performed using the standard protocol following bolus administration of intravenous contrast.  CONTRAST:  70m OMNIPAQUE IOHEXOL 300 MG/ML  SOLN  COMPARISON:  None available.  FINDINGS: Mild subsegmental atelectasis seen dependently within the visualized lung bases. No pleural or pericardial effusion.  Single 5 mm hypodensity within the right hepatic lobe is too small the characterize by CT, but may represent a small cyst. The liver is otherwise unremarkable. Gallbladder is within normal limits. No biliary ductal dilatation. The spleen, adrenal glands, and pancreas demonstrate a normal contrast enhanced appearance.  The kidneys are equal in size with symmetric enhancement. No nephrolithiasis, hydronephrosis, or focal enhancing renal mass.  Stomach within normal limits with enteric contrast material and ingested contents present within the gastric lumen. Circumferential wall thickening with mild inflammatory stranding seen about several prominent loops of jejunum in the left and central abdomen (series 204, image 26 on coronal sequence). Small amount of free fluid also present within the adjacent left hemi abdomen. Fecalization of enteric contents seen within this  loop of bowel, suggesting slow transit. Few mildly prominent loops of small bowel  measuring up to 3 cm in diameter are present within the right hemi abdomen just proximal to this (series 201, image 36). Distally, the ileum is within normal limits. The colon is of normal caliber and appearance without acute inflammatory changes. Appendix is within normal limits.  Bladder is within normal limits.  Prostate are unremarkable.  Small volume free fluid present within the pelvis (series 201, image 69), which may be related to active inflammation or possibly recent surgery.  No free intraperitoneal air.  No pathologically enlarged intra-abdominal or pelvic lymph nodes are identified. Normal intravascular enhancement seen throughout the abdomen and pelvis.  No acute osseous abnormality. No worrisome lytic or blastic osseous lesions.  IMPRESSION: 1. Circumferential wall thickening with inflammatory stranding about several prominent loops of jejunum in the left and central abdomen. While this finding may be in part related to recent surgery, acute enteritis could also have this appearance. Fecalization of enteric contents within this loop of bowel with a few mildly prominent loops of bowel proximally suggest slow transit. No overt evidence of bowel obstruction at this time. 2. Small volume free fluid within the pelvis and left hemi abdomen, which may be related to active inflammation or possibly recent surgery.   Electronically Signed   By: Jeannine Boga M.D.   On: 05/25/2014 15:17    Review of Systems  All other systems reviewed and are negative.   Blood pressure 112/66, pulse 79, temperature 98 F (36.7 C), temperature source Oral, resp. rate 24, SpO2 100.00%. Physical Exam  Constitutional: He is oriented to person, place, and time. He appears well-developed and well-nourished. No distress.  Neck: Normal range of motion. Neck supple.  Cardiovascular: Normal rate, regular rhythm, normal heart sounds and  intact distal pulses.  Exam reveals no gallop and no friction rub.   No murmur heard. Respiratory: Effort normal and breath sounds normal. No respiratory distress. He has no wheezes. He has no rales. He exhibits no tenderness.  GI: Soft. Bowel sounds are normal. He exhibits no distension and no mass. There is no rebound and no guarding.  Mild diffuse tenderness, most prominent to Rt mid abdomen.  Healing midline incision.   Musculoskeletal: Normal range of motion. He exhibits no edema and no tenderness.  Lymphadenopathy:    He has no cervical adenopathy.  Neurological: He is alert and oriented to person, place, and time.  Skin: Skin is warm and dry. He is not diaphoretic.  Psychiatric: He has a normal mood and affect. His behavior is normal. Judgment normal.     Assessment/Plan S/p diagnostic laparoscopy with conversion to laparotomy, LOA and reduction of internal hernia on 05/08/14([place], [place]) Jejunal inflammation and stranding Small bowel fecalization Small amount of free fluid within the pelvis and left hemi abdomen  -Admit for IV hydration -NPO x ice chips, NGT if N/V or worsening abdominal pain -Repeat AXR in AM -Repeat labs in AM -If no improvement in symptoms, will consider a UGI with SBFT to evaluate for a stricture -PPI     Ansley Mangiapane ANP-BC 05/25/2014, 4:03 PM

## 2014-05-25 NOTE — ED Provider Notes (Signed)
CSN: [id number]     Arrival date & time 05/25/14  1153 History   First MD Initiated Contact with Patient 05/25/14 1220     Chief Complaint  Patient presents with  . Abdominal Pain     (Consider location/radiation/quality/duration/timing/severity/associated sxs/prior Treatment) HPI  Deion Swift is a 43 y.o. male complaining of acute onset of epigastric abdominal pain described as colicky and severe, woke him from sleep at 2 AM. Patient had surgery for small bowel obstruction abnormality 2 weeks ago while he was traveling to [facility]. Records show patient had an internal hernia with mesenteric ischemia, laparotomy with lysis of adhesions. Patient states that he has been recovering well, taking Motrin and acetaminophen for relief. Patient denies fever, chills, nausea, vomiting, change in bowel or bladder habits, he has been passing flatus normally. There is no melena or hematochezia. States that this pain feels like when he had a bowel obstruction several weeks ago.  History reviewed. No pertinent past medical history. Past Surgical History  Procedure Laterality Date  . Abdominal surgery    . Hernia repair     History reviewed. No pertinent family history. History  Substance Use Topics  . Smoking status: Never Smoker   . Smokeless tobacco: Not on file  . Alcohol Use: Yes     Comment: 1  a day    Review of Systems  10 systems reviewed and found to be negative, except as noted in the HPI.   Allergies  Aspirin  Home Medications   Prior to Admission medications   Medication Sig Start Date End Date Taking? Authorizing Provider  finasteride (PROSCAR) 5 MG tablet Take 1.25 mg by mouth 3 (three) times a week. On Monday, Wednesday and Friday.   Yes Historical Provider, MD  fluticasone (FLONASE) 50 MCG/ACT nasal spray Place 2 sprays into both nostrils daily.   Yes Historical Provider, MD  Melatonin 3 MG CAPS Take 3 mg by mouth at bedtime.   Yes Historical Provider, MD  omeprazole  (PRILOSEC OTC) 20 MG tablet Take 20 mg by mouth daily.   Yes Historical Provider, MD   BP 106/75  Pulse 78  Temp(Src) 98 F (36.7 C) (Oral)  Resp 19  SpO2 100% Physical Exam  Nursing note and vitals reviewed. Constitutional: He is oriented to person, place, and time. He appears well-developed and well-nourished. No distress.  HENT:  Head: Normocephalic.  Eyes: Conjunctivae and EOM are normal.  Cardiovascular: Normal rate.   Pulmonary/Chest: Effort normal and breath sounds normal. No stridor.  Abdominal: Soft. Bowel sounds are normal. He exhibits no distension and no mass. There is tenderness. There is no rebound and no guarding.  Well-healing lower midline surgical incision. Clean dry and intact. No warmth, discharge or induration.   Diffusely tender to palpation with no focal tenderness, guarding or rebound.  Musculoskeletal: Normal range of motion. He exhibits no edema.  Neurological: He is alert and oriented to person, place, and time.  Psychiatric: He has a normal mood and affect.    ED Course  Procedures (including critical care time) Labs Review Labs Reviewed  CBC WITH DIFFERENTIAL - Abnormal; Notable for the following:    WBC 12.0 (*)    Neutrophils Relative % 81 (*)    Neutro Abs 9.9 (*)    Lymphocytes Relative 10 (*)    All other components within normal limits  COMPREHENSIVE METABOLIC PANEL - Abnormal; Notable for the following:    Glucose, Bld 109 (*)    Alkaline Phosphatase 37 (*)  Anion gap 16 (*)    All other components within normal limits  LIPASE, BLOOD - Abnormal; Notable for the following:    Lipase 65 (*)    All other components within normal limits  URINALYSIS, ROUTINE W REFLEX MICROSCOPIC    Imaging Review Ct Abdomen Pelvis W Contrast  05/25/2014   CLINICAL DATA:  Abdominal pain, recent bowel obstruction due to hernia.  EXAM: CT ABDOMEN AND PELVIS WITH CONTRAST  TECHNIQUE: Multidetector CT imaging of the abdomen and pelvis was performed using the  standard protocol following bolus administration of intravenous contrast.  CONTRAST:  63mL OMNIPAQUE IOHEXOL 300 MG/ML  SOLN  COMPARISON:  None available.  FINDINGS: Mild subsegmental atelectasis seen dependently within the visualized lung bases. No pleural or pericardial effusion.  Single 5 mm hypodensity within the right hepatic lobe is too small the characterize by CT, but may represent a small cyst. The liver is otherwise unremarkable. Gallbladder is within normal limits. No biliary ductal dilatation. The spleen, adrenal glands, and pancreas demonstrate a normal contrast enhanced appearance.  The kidneys are equal in size with symmetric enhancement. No nephrolithiasis, hydronephrosis, or focal enhancing renal mass.  Stomach within normal limits with enteric contrast material and ingested contents present within the gastric lumen. Circumferential wall thickening with mild inflammatory stranding seen about several prominent loops of jejunum in the left and central abdomen (series 204, image 26 on coronal sequence). Small amount of free fluid also present within the adjacent left hemi abdomen. Fecalization of enteric contents seen within this loop of bowel, suggesting slow transit. Few mildly prominent loops of small bowel measuring up to 3 cm in diameter are present within the right hemi abdomen just proximal to this (series 201, image 36). Distally, the ileum is within normal limits. The colon is of normal caliber and appearance without acute inflammatory changes. Appendix is within normal limits.  Bladder is within normal limits.  Prostate are unremarkable.  Small volume free fluid present within the pelvis (series 201, image 69), which may be related to active inflammation or possibly recent surgery.  No free intraperitoneal air.  No pathologically enlarged intra-abdominal or pelvic lymph nodes are identified. Normal intravascular enhancement seen throughout the abdomen and pelvis.  No acute osseous  abnormality. No worrisome lytic or blastic osseous lesions.  IMPRESSION: 1. Circumferential wall thickening with inflammatory stranding about several prominent loops of jejunum in the left and central abdomen. While this finding may be in part related to recent surgery, acute enteritis could also have this appearance. Fecalization of enteric contents within this loop of bowel with a few mildly prominent loops of bowel proximally suggest slow transit. No overt evidence of bowel obstruction at this time. 2. Small volume free fluid within the pelvis and left hemi abdomen, which may be related to active inflammation or possibly recent surgery.   Electronically Signed   By: Jeannine Boga M.D.   On: 05/25/2014 15:17     EKG Interpretation None      MDM   Final diagnoses:  Epigastric pain   Filed Vitals:   05/25/14 1521 05/25/14 1600 05/25/14 1624 05/25/14 1628  BP:  116/84 106/75   Pulse:  80 78   Temp: 98 F (36.7 C)  98 F (36.7 C) 98 F (36.7 C)  TempSrc: Oral  Oral Oral  Resp:  11 19   SpO2:  100% 100%     Medications  morphine 4 MG/ML injection 4 mg (4 mg Intravenous Given 05/25/14 1527)  enoxaparin (LOVENOX)  injection 40 mg (not administered)  dextrose 5 %-0.9 % sodium chloride infusion (not administered)  ondansetron (ZOFRAN) injection 4 mg (not administered)  morphine 2 MG/ML injection 2-4 mg (not administered)  pantoprazole (PROTONIX) injection 40 mg (not administered)  ondansetron (ZOFRAN) injection 4 mg (4 mg Intravenous Given 05/25/14 1255)  sodium chloride 0.9 % bolus 1,000 mL (0 mLs Intravenous Stopped 05/25/14 1421)  sodium chloride 0.9 % bolus 1,000 mL (0 mLs Intravenous Stopped 05/25/14 1627)  iohexol (OMNIPAQUE) 300 MG/ML solution 80 mL (80 mLs Intravenous Contrast Given 05/25/14 1436)    Mikell Kazlauskas is a 43 y.o. male presenting with acute onset of epigastric pain which woke him from sleep at 2 AM. Patient is two-week status post exploratory laparoscopy for SBO  and lysis of adhesions secondary to internal hernia. Patient with normal vital signs in the ED. His mild leukocytosis of 12.0. Serial abdominal exams are benign. CAT scan shows circumferential wall thickening with inflammatory stranding. Case discussed with [facility], they have evaluated the patient and feel that he warrants admission and observation. They will admit to their service.      Monico Blitz, PA-C 05/25/14 [phone]

## 2014-05-25 NOTE — Progress Notes (Signed)
Patient ID: Stephen [name], male   DOB: 1971/07/21, 43 y.o.   MRN: [id number]   I have seen and examined the patient and agree with the assessment and plans.  His CT findings are worrisome as well as the fact that he has not been able to eat solid food without cramping abdominal pain since surgery.  I'm worried about a stricture.  Clinically, his abdomen is full but soft with minimal tenderness. I will hold on an NG unless his pain recurs.  The plan is to admit, IV rehydrate, and check abdominal plain films in the morning.  He may eventually need a small bowel follow through to see if there is a chronic stricture.  Brandee Markin A. Ninfa Linden  MD, FACS

## 2014-05-25 NOTE — ED Notes (Signed)
Report attempted 

## 2014-05-25 NOTE — ED Notes (Signed)
Pt. Was in [place] on vacation. Developed severe abdominal pain and they discovered a small bowel  Obstruction due to a hernia.  Pt. doing well with recovery and then last night approximately 200 am developed severe abdominal pain  Above the umbilicus.  Pt. Had a normal BM this am no blood , pt. Voiding without  Difficulty denies any blood in urine.  Pt.  Reports that this sharp abdominal pain has happened 3 times in the last 2 weeks but has gone away. The pain is worse today.  Denies any vomiting or nausea

## 2014-05-26 ENCOUNTER — Observation Stay (HOSPITAL_COMMUNITY): Payer: BLUE CROSS/BLUE SHIELD

## 2014-05-26 LAB — BASIC METABOLIC PANEL
Anion gap: 10 (ref 5–15)
BUN: 10 mg/dL (ref 6–23)
CALCIUM: 8.3 mg/dL — AB (ref 8.4–10.5)
CO2: 26 mEq/L (ref 19–32)
Chloride: 110 mEq/L (ref 96–112)
Creatinine, Ser: 1.07 mg/dL (ref 0.50–1.35)
GFR, EST NON AFRICAN AMERICAN: 84 mL/min — AB (ref >90)
Glucose, Bld: 106 mg/dL — ABNORMAL HIGH (ref 70–99)
POTASSIUM: 3.7 meq/L (ref 3.7–5.3)
Sodium: 146 mEq/L (ref 137–147)

## 2014-05-26 LAB — CBC
HCT: 36.3 % — ABNORMAL LOW (ref 39.0–52.0)
Hemoglobin: 12.3 g/dL — ABNORMAL LOW (ref 13.0–17.0)
MCH: 32.4 pg (ref 26.0–34.0)
MCHC: 33.9 g/dL (ref 30.0–36.0)
MCV: 95.5 fL (ref 78.0–100.0)
Platelets: 169 10*3/uL (ref 150–400)
RBC: 3.8 MIL/uL — AB (ref 4.22–5.81)
RDW: 12.9 % (ref 11.5–15.5)
WBC: 4.7 10*3/uL (ref 4.0–10.5)

## 2014-05-26 MED ORDER — PANTOPRAZOLE SODIUM 40 MG PO TBEC: 40.0000 mg | DELAYED_RELEASE_TABLET | Freq: Every day | ORAL | Status: DC

## 2014-05-26 NOTE — Progress Notes (Signed)
Pt. Discharge to home. Discharge instruction given to patient. No question verbalized. 

## 2014-05-26 NOTE — Discharge Summary (Signed)
Physician Discharge Summary  Stephen [name] EVO:[id number] DOB: 1971/08/04 DOA: 05/25/2014  PCP: Chancy Hurter, MD  Consultation: none  Admit date: 05/25/2014 Discharge date: 05/26/2014  Recommendations for Outpatient Follow-up:   Follow-up Information   Follow up with Harl Bowie, MD On 06/06/2014. (arrive by 1:45PM for a 2PM appt)    Specialty:  General Surgery   Contact information:   [place] [address] [phone]      Discharge Diagnoses:  1. S/p diagnostic laparoscopy with conversion to laparotomy, LOA and reduction of internal hernia 05/08/14([place] [place]) 2. Jejunal inflammation and stranding 3. Small bowel fecalization 4. Small amount of free fluid within the pelvis and left hemi pelvis   Surgical Procedure: none  Discharge Condition: stable Disposition: home  Diet recommendation: liquids, ensure  Filed Weights   05/25/14 1903  Weight: 139 lb 9.6 oz (63.322 kg)     Filed Vitals:   05/26/14 1318  BP: 103/60  Pulse: 61  Temp:   Resp: 16     Hospital Course:  Stephen [name] is a healthy 43 year old male who was vacationing in [place] 2.5 weeks ago who developed abdominal pain. He was found to have an internal hernia causing an obstruction. He underwent an exploratory laparotomy with LOA, hernia repair. He presented to [facility] with intermittent severe abdominal pain each time he ate solid foods.  His work up shows a white count of 12K, normal electrolytes. A CT of abdomen and pelvis showed small bowel wall thickening with stranding, fecalization of small bowel and small volume free fluid within the pelvis.  He was admitted for monitoring, concerns for a stricture.  He was kept NPO, IV hydration.  He did not have recurrence in symptoms and was started on clears.  Due to concerns for a stricture, he will be kept on liquids with ensure supplement.  He will follow up with Dr. Ninfa Linden on the 22nd to evaluate the need for an UGI with  SBFT.  We discussed warning signs that warrant immediate attention.  I advised him to stay on PPI once daily and to stop the NSAIDs.  He was encouraged to call with questions or concerns.    Discharge Instructions     Medication List    STOP taking these medications       omeprazole 20 MG tablet  Commonly known as:  PRILOSEC OTC      TAKE these medications       finasteride 5 MG tablet  Commonly known as:  PROSCAR  Take 1.25 mg by mouth 3 (three) times a week. On Monday, Wednesday and Friday.     fluticasone 50 MCG/ACT nasal spray  Commonly known as:  FLONASE  Place 2 sprays into both nostrils daily.     Melatonin 3 MG Caps  Take 3 mg by mouth at bedtime.     pantoprazole 40 MG tablet  Commonly known as:  PROTONIX  Take 1 tablet (40 mg total) by mouth daily.           Follow-up Information   Follow up with Harl Bowie, MD On 06/06/2014. (arrive by 1:45PM for a 2PM appt)    Specialty:  General Surgery   Contact information:   [place] [address] [phone]        The results of significant diagnostics from this hospitalization (including imaging, microbiology, ancillary and laboratory) are listed below for reference.    Significant Diagnostic Studies: Ct Abdomen  Pelvis W Contrast  05/25/2014   CLINICAL DATA:  Abdominal pain, recent bowel obstruction due to hernia.  EXAM: CT ABDOMEN AND PELVIS WITH CONTRAST  TECHNIQUE: Multidetector CT imaging of the abdomen and pelvis was performed using the standard protocol following bolus administration of intravenous contrast.  CONTRAST:  32mL OMNIPAQUE IOHEXOL 300 MG/ML  SOLN  COMPARISON:  None available.  FINDINGS: Mild subsegmental atelectasis seen dependently within the visualized lung bases. No pleural or pericardial effusion.  Single 5 mm hypodensity within the right hepatic lobe is too small the characterize by CT, but may represent a small cyst. The liver is otherwise unremarkable.  Gallbladder is within normal limits. No biliary ductal dilatation. The spleen, adrenal glands, and pancreas demonstrate a normal contrast enhanced appearance.  The kidneys are equal in size with symmetric enhancement. No nephrolithiasis, hydronephrosis, or focal enhancing renal mass.  Stomach within normal limits with enteric contrast material and ingested contents present within the gastric lumen. Circumferential wall thickening with mild inflammatory stranding seen about several prominent loops of jejunum in the left and central abdomen (series 204, image 26 on coronal sequence). Small amount of free fluid also present within the adjacent left hemi abdomen. Fecalization of enteric contents seen within this loop of bowel, suggesting slow transit. Few mildly prominent loops of small bowel measuring up to 3 cm in diameter are present within the right hemi abdomen just proximal to this (series 201, image 36). Distally, the ileum is within normal limits. The colon is of normal caliber and appearance without acute inflammatory changes. Appendix is within normal limits.  Bladder is within normal limits.  Prostate are unremarkable.  Small volume free fluid present within the pelvis (series 201, image 69), which may be related to active inflammation or possibly recent surgery.  No free intraperitoneal air.  No pathologically enlarged intra-abdominal or pelvic lymph nodes are identified. Normal intravascular enhancement seen throughout the abdomen and pelvis.  No acute osseous abnormality. No worrisome lytic or blastic osseous lesions.  IMPRESSION: 1. Circumferential wall thickening with inflammatory stranding about several prominent loops of jejunum in the left and central abdomen. While this finding may be in part related to recent surgery, acute enteritis could also have this appearance. Fecalization of enteric contents within this loop of bowel with a few mildly prominent loops of bowel proximally suggest slow transit.  No overt evidence of bowel obstruction at this time. 2. Small volume free fluid within the pelvis and left hemi abdomen, which may be related to active inflammation or possibly recent surgery.   Electronically Signed   By: Jeannine Boga M.D.   On: 05/25/2014 15:17   Dg Abd 2 Views  05/26/2014   CLINICAL DATA:  Abdominal pain  EXAM: ABDOMEN - 2 VIEW  COMPARISON:  May 25, 2014 CT abdomen and pelvis  FINDINGS: Supine and upright images were obtained. There is contrast in the colon. The bowel gas pattern is unremarkable. No obstruction or free air. There is moderate stool throughout the colon. There are phleboliths in the pelvis.  IMPRESSION: Bowel gas pattern overall unremarkable.   Electronically Signed   By: Lowella Grip M.D.   On: 05/26/2014 08:08    Microbiology: No results found for this or any previous visit (from the past 240 hour(s)).   Labs: Basic Metabolic Panel:  Recent Labs Lab 05/25/14 1230 05/26/14 0500  NA 141 146  K 3.7 3.7  CL 100 110  CO2 25 26  GLUCOSE 109* 106*  BUN 16 10  CREATININE 1.00 1.07  CALCIUM 10.2 8.3*   Liver Function Tests:  Recent Labs Lab 05/25/14 1230  AST 21  ALT 18  ALKPHOS 37*  BILITOT 0.7  PROT 7.2  ALBUMIN 4.6    Recent Labs Lab 05/25/14 1230  LIPASE 65*   No results found for this basename: AMMONIA,  in the last 168 hours CBC:  Recent Labs Lab 05/25/14 1230 05/26/14 0500  WBC 12.0* 4.7  NEUTROABS 9.9*  --   HGB 15.5 12.3*  HCT 44.2 36.3*  MCV 90.8 95.5  PLT 247 169   Cardiac Enzymes: No results found for this basename: CKTOTAL, CKMB, CKMBINDEX, TROPONINI,  in the last 168 hours BNP: BNP (last 3 results) No results found for this basename: PROBNP,  in the last 8760 hours CBG: No results found for this basename: GLUCAP,  in the last 168 hours  Active Problems:   Abdominal pain   Time coordinating discharge: <30 mins  Signed:  Deondrea Aguado, ANP-BC

## 2014-05-26 NOTE — Progress Notes (Signed)
Pt. Tolerated clear liquid diet well.  No complaint of pain or discomfort.

## 2014-05-26 NOTE — Progress Notes (Signed)
Utilization review completed.  

## 2014-05-26 NOTE — Progress Notes (Signed)
Patient ID: Stephen [name], male   DOB: 27-Mar-1971, 43 y.o.   MRN: [id number]     [facility]      [address] [place], [place] [address]    Phone: [phone] FAX: [phone]     Subjective: Afebrile.  VSS.  No further symptoms.    Objective:  Vital signs:  Filed Vitals:   05/25/14 1903 05/25/14 2126 05/26/14 0149 05/26/14 0600  BP: 102/58 1_0  Pulse: 55 53 49 53  Temp: 97.5 F (36.4 C) 97.4 F (36.3 C) 97.5 F (36.4 C) 97.7 F (36.5 C)  TempSrc: Oral Oral Oral Oral  Resp: _1 Height: _2  (1.753 m)     Weight: 139 lb 9.6 oz (63.322 kg)     SpO2: 100% 100% 100% 100%    Last BM Date: 05/25/14  Intake/Output   Yesterday:  09/10 0701 - 09/11 0700 In: 936.3 [I.V.:936.3] Out: 650 [Urine:650] This shift:    I/O last 3 completed shifts: In: 936.3 [I.V.:936.3] Out: 650 [Urine:650]    Physical Exam: General: Pt awake/alert/oriented x4 in no acute distress Chest: cta.  No chest wall pain w good excursion CV:  Pulses intact.  Regular rhythm MS: Normal AROM mjr joints.  No obvious deformity Abdomen: Soft.  Nondistended.  Non tender.  Healing midline incision.  No evidence of peritonitis.  No incarcerated hernias. Ext:  SCDs BLE.  No mjr edema.  No cyanosis Skin: No petechiae / purpura   Problem List:   Active Problems:   Abdominal pain    Results:   Labs: Results for orders placed during the hospital encounter of 05/25/14 (from the past 48 hour(s))  CBC WITH DIFFERENTIAL     Status: Abnormal   Collection Time    05/25/14 12:30 PM      Result Value Ref Range   WBC 12.0 (*) 4.0 - 10.5 K/uL   RBC 4.87  4.22 - 5.81 MIL/uL   Hemoglobin 15.5  13.0 - 17.0 g/dL   HCT 44.2  39.0 - 52.0 %   MCV 90.8  78.0 - 100.0 fL   MCH 31.8  26.0 - 34.0 pg   MCHC 35.1  30.0 - 36.0 g/dL   RDW 12.5  11.5 - 15.5 %   Platelets 247  150 - 400 K/uL   Neutrophils Relative % 81 (*) 43 - 77 %   Neutro Abs 9.9  (*) 1.7 - 7.7 K/uL   Lymphocytes Relative 10 (*) 12 - 46 %   Lymphs Abs 1.2  0.7 - 4.0 K/uL   Monocytes Relative 8  3 - 12 %   Monocytes Absolute 0.9  0.1 - 1.0 K/uL   Eosinophils Relative 1  0 - 5 %   Eosinophils Absolute 0.1  0.0 - 0.7 K/uL   Basophils Relative 0  0 - 1 %   Basophils Absolute 0.0  0.0 - 0.1 K/uL  COMPREHENSIVE METABOLIC PANEL     Status: Abnormal   Collection Time    05/25/14 12:30 PM      Result Value Ref Range   Sodium 141  137 - 147 mEq/L   Potassium 3.7  3.7 - 5.3 mEq/L   Chloride 100  96 - 112 mEq/L   CO2 25  19 - 32 mEq/L   Glucose, Bld 109 (*) 70 - 99 mg/dL   BUN 16  6 - 23 mg/dL   Creatinine, Ser 1.00  0.50 - 1.35  mg/dL   Calcium 10.2  8.4 - 10.5 mg/dL   Total Protein 7.2  6.0 - 8.3 g/dL   Albumin 4.6  3.5 - 5.2 g/dL   AST 21  0 - 37 U/L   ALT 18  0 - 53 U/L   Alkaline Phosphatase 37 (*) 39 - 117 U/L   Total Bilirubin 0.7  0.3 - 1.2 mg/dL   GFR calc non Af Amer >90  >90 mL/min   GFR calc Af Amer >90  >90 mL/min   Comment: (NOTE)     The eGFR has been calculated using the CKD EPI equation.     This calculation has not been validated in all clinical situations.     eGFR's persistently <90 mL/min signify possible Chronic Kidney     Disease.   Anion gap 16 (*) 5 - 15  LIPASE, BLOOD     Status: Abnormal   Collection Time    05/25/14 12:30 PM      Result Value Ref Range   Lipase 65 (*) 11 - 59 U/L  URINALYSIS, ROUTINE W REFLEX MICROSCOPIC     Status: None   Collection Time    05/25/14  2:15 PM      Result Value Ref Range   Color, Urine YELLOW  YELLOW   APPearance CLEAR  CLEAR   Specific Gravity, Urine 1.008  1.005 - 1.030   pH 7.5  5.0 - 8.0   Glucose, UA NEGATIVE  NEGATIVE mg/dL   Hgb urine dipstick NEGATIVE  NEGATIVE   Bilirubin Urine NEGATIVE  NEGATIVE   Ketones, ur NEGATIVE  NEGATIVE mg/dL   Protein, ur NEGATIVE  NEGATIVE mg/dL   Urobilinogen, UA 0.2  0.0 - 1.0 mg/dL   Nitrite NEGATIVE  NEGATIVE   Leukocytes, UA NEGATIVE  NEGATIVE    Comment: MICROSCOPIC NOT DONE ON URINES WITH NEGATIVE PROTEIN, BLOOD, LEUKOCYTES, NITRITE, OR GLUCOSE <1000 mg/dL.  BASIC METABOLIC PANEL     Status: Abnormal   Collection Time    05/26/14  5:00 AM      Result Value Ref Range   Sodium 146  137 - 147 mEq/L   Potassium 3.7  3.7 - 5.3 mEq/L   Chloride 110  96 - 112 mEq/L   Comment: DELTA CHECK NOTED   CO2 26  19 - 32 mEq/L   Glucose, Bld 106 (*) 70 - 99 mg/dL   BUN 10  6 - 23 mg/dL   Creatinine, Ser 1.07  0.50 - 1.35 mg/dL   Calcium 8.3 (*) 8.4 - 10.5 mg/dL   GFR calc non Af Amer 84 (*) >90 mL/min   GFR calc Af Amer >90  >90 mL/min   Comment: (NOTE)     The eGFR has been calculated using the CKD EPI equation.     This calculation has not been validated in all clinical situations.     eGFR's persistently <90 mL/min signify possible Chronic Kidney     Disease.   Anion gap 10  5 - 15  CBC     Status: Abnormal   Collection Time    05/26/14  5:00 AM      Result Value Ref Range   WBC 4.7  4.0 - 10.5 K/uL   RBC 3.80 (*) 4.22 - 5.81 MIL/uL   Hemoglobin 12.3 (*) 13.0 - 17.0 g/dL   Comment: REPEATED TO VERIFY   HCT 36.3 (*) 39.0 - 52.0 %   MCV 95.5  78.0 - 100.0 fL   Comment: REPEATED TO  VERIFY   MCH 32.4  26.0 - 34.0 pg   MCHC 33.9  30.0 - 36.0 g/dL   RDW 12.9  11.5 - 15.5 %   Comment: CONSISTENT WITH PREVIOUS RESULT   Platelets 169  150 - 400 K/uL   Comment: REPEATED TO VERIFY    Imaging / Studies: Ct Abdomen Pelvis W Contrast  05/25/2014   CLINICAL DATA:  Abdominal pain, recent bowel obstruction due to hernia.  EXAM: CT ABDOMEN AND PELVIS WITH CONTRAST  TECHNIQUE: Multidetector CT imaging of the abdomen and pelvis was performed using the standard protocol following bolus administration of intravenous contrast.  CONTRAST:  56m OMNIPAQUE IOHEXOL 300 MG/ML  SOLN  COMPARISON:  None available.  FINDINGS: Mild subsegmental atelectasis seen dependently within the visualized lung bases. No pleural or pericardial effusion.  Single 5 mm  hypodensity within the right hepatic lobe is too small the characterize by CT, but may represent a small cyst. The liver is otherwise unremarkable. Gallbladder is within normal limits. No biliary ductal dilatation. The spleen, adrenal glands, and pancreas demonstrate a normal contrast enhanced appearance.  The kidneys are equal in size with symmetric enhancement. No nephrolithiasis, hydronephrosis, or focal enhancing renal mass.  Stomach within normal limits with enteric contrast material and ingested contents present within the gastric lumen. Circumferential wall thickening with mild inflammatory stranding seen about several prominent loops of jejunum in the left and central abdomen (series 204, image 26 on coronal sequence). Small amount of free fluid also present within the adjacent left hemi abdomen. Fecalization of enteric contents seen within this loop of bowel, suggesting slow transit. Few mildly prominent loops of small bowel measuring up to 3 cm in diameter are present within the right hemi abdomen just proximal to this (series 201, image 36). Distally, the ileum is within normal limits. The colon is of normal caliber and appearance without acute inflammatory changes. Appendix is within normal limits.  Bladder is within normal limits.  Prostate are unremarkable.  Small volume free fluid present within the pelvis (series 201, image 69), which may be related to active inflammation or possibly recent surgery.  No free intraperitoneal air.  No pathologically enlarged intra-abdominal or pelvic lymph nodes are identified. Normal intravascular enhancement seen throughout the abdomen and pelvis.  No acute osseous abnormality. No worrisome lytic or blastic osseous lesions.  IMPRESSION: 1. Circumferential wall thickening with inflammatory stranding about several prominent loops of jejunum in the left and central abdomen. While this finding may be in part related to recent surgery, acute enteritis could also have this  appearance. Fecalization of enteric contents within this loop of bowel with a few mildly prominent loops of bowel proximally suggest slow transit. No overt evidence of bowel obstruction at this time. 2. Small volume free fluid within the pelvis and left hemi abdomen, which may be related to active inflammation or possibly recent surgery.   Electronically Signed   By: BJeannine BogaM.D.   On: 05/25/2014 15:17   Dg Abd 2 Views  05/26/2014   CLINICAL DATA:  Abdominal pain  EXAM: ABDOMEN - 2 VIEW  COMPARISON:  May 25, 2014 CT abdomen and pelvis  FINDINGS: Supine and upright images were obtained. There is contrast in the colon. The bowel gas pattern is unremarkable. No obstruction or free air. There is moderate stool throughout the colon. There are phleboliths in the pelvis.  IMPRESSION: Bowel gas pattern overall unremarkable.   Electronically Signed   By: WLowella GripM.D.   On: 05/26/2014  08:08    Medications / Allergies:  Scheduled Meds: . enoxaparin (LOVENOX) injection  40 mg Subcutaneous Q24H  . pantoprazole (PROTONIX) IV  40 mg Intravenous QHS   Continuous Infusions: . dextrose 5 % and 0.9% NaCl 175 mL/hr at 05/26/14 0413   PRN Meds:.morphine injection, ondansetron, zolpidem  Antibiotics: Anti-infectives   None        Assessment/Plan S/p diagnostic laparoscopy with conversion to laparotomy, LOA and reduction of internal hernia on 05/08/14([place] [place])  Jejunal inflammation and stranding  Small bowel fecalization  Small amount of free fluid within the pelvis and left hemi abdomen -Keep NPO until Dr. Ninfa Linden evaluates the patient.  AXR unremarkable, WBC normal. -reduce IVF -continue with PPI at DC -SCD/lovenox -mobilize    Erby Pian, [facility] Surgery Pager [phone](7A-4:30P) For consults and floor pages call [phone](7A-4:30P)  05/26/2014 8:44 AM

## 2014-05-26 NOTE — Discharge Instructions (Signed)
Stay on a liquid diet.  Please supplement with ensure to maintain daily caloric/protein intake  I have prescribed protonix for you to take for a month.  You may also double up on your prilosec if you wish.  I would recommend you stop taking any ibuprofen/motrin.  You may take tylenol or the vicodin if needed for pain.

## 2014-05-26 NOTE — Progress Notes (Signed)
I have seen and examined the patient and agree with the assessment and plans. xrays ok If he tolerates clears, will discharge and followup as outpt  Shakira Los A. Ninfa Linden  MD, FACS

## 2014-05-29 NOTE — ED Provider Notes (Signed)
Medical screening examination/treatment/procedure(s) were conducted as a shared visit with non-physician practitioner(s) and myself.  I personally evaluated the patient during the encounter.   EKG Interpretation   Date/Time:  Thursday May 25 2014 13:11:39 EDT Ventricular Rate:  65 PR Interval:  168 QRS Duration: 98 QT Interval:  413 QTC Calculation: 429 R Axis:   61 Text Interpretation:  Sinus rhythm RSR' in V1 or V2, probably normal  variant ST elev, probable normal early repol pattern No old tracing to  compare Confirmed by [facility]  MD, TREY [phone]) on 05/26/2014 7:50:06 PM        Jenny Reichmann Elba Barman III, MD 05/29/14 [phone]

## 2014-08-15 ENCOUNTER — Other Ambulatory Visit: Payer: Self-pay | Admitting: Dermatology

## 2015-03-08 IMAGING — CT CT ABD-PELV W/ CM
2 of 5 series · 4 of 46 positions shown, 6 images · IV contrast (Iodine)
Comparison: None available.

CLINICAL DATA: Abdominal pain, recent bowel obstruction due to
hernia.

EXAM:
CT ABDOMEN AND PELVIS WITH CONTRAST
TECHNIQUE: Multidetector CT imaging of the abdomen and pelvis was performed
using the standard protocol following bolus administration of
intravenous contrast.
CONTRAST:  80mL OMNIPAQUE IOHEXOL 300 MG/ML  SOLN

[Series 204: cor · coronal · 0.50mm/px · 3 of 68 slices shown, 4 images]
[im 15/68  soft-tissue]
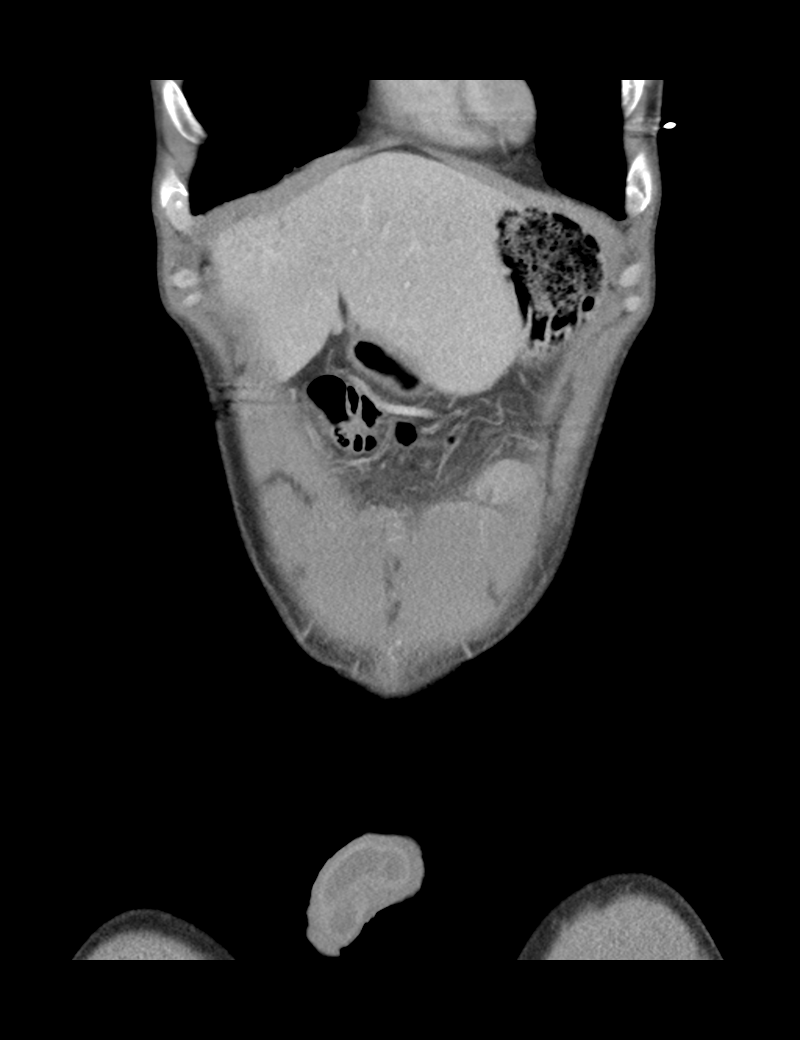
[im 15/68  bone]
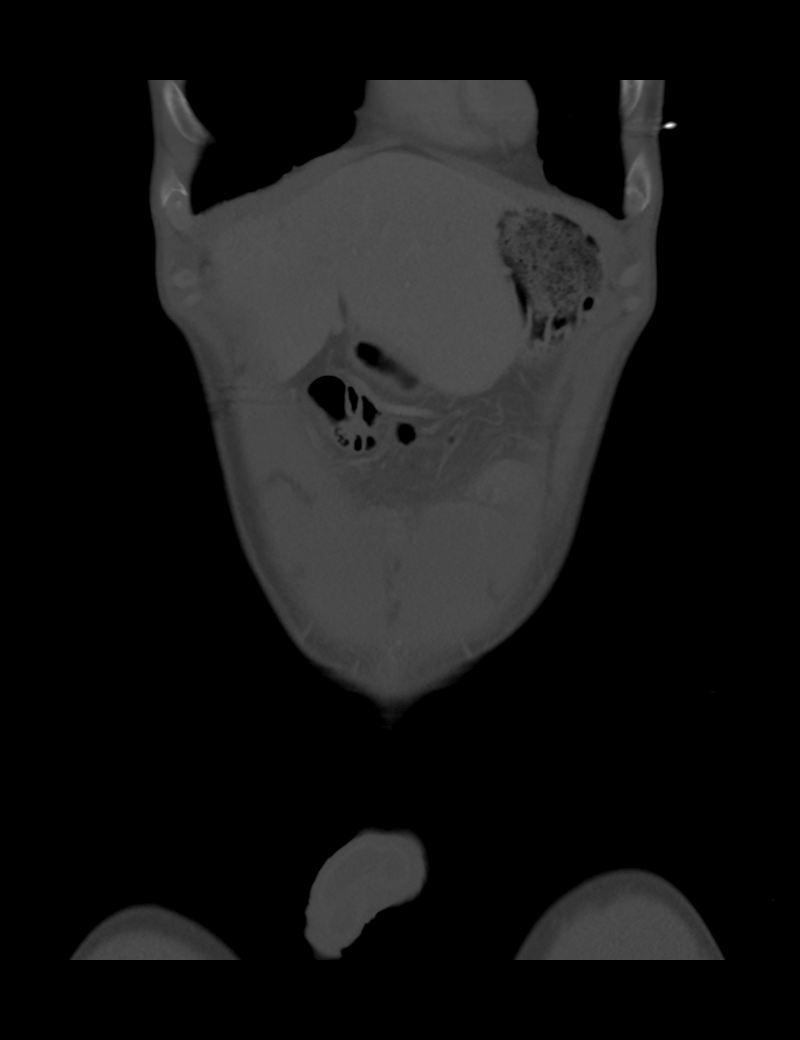
[im 38/68  soft-tissue]
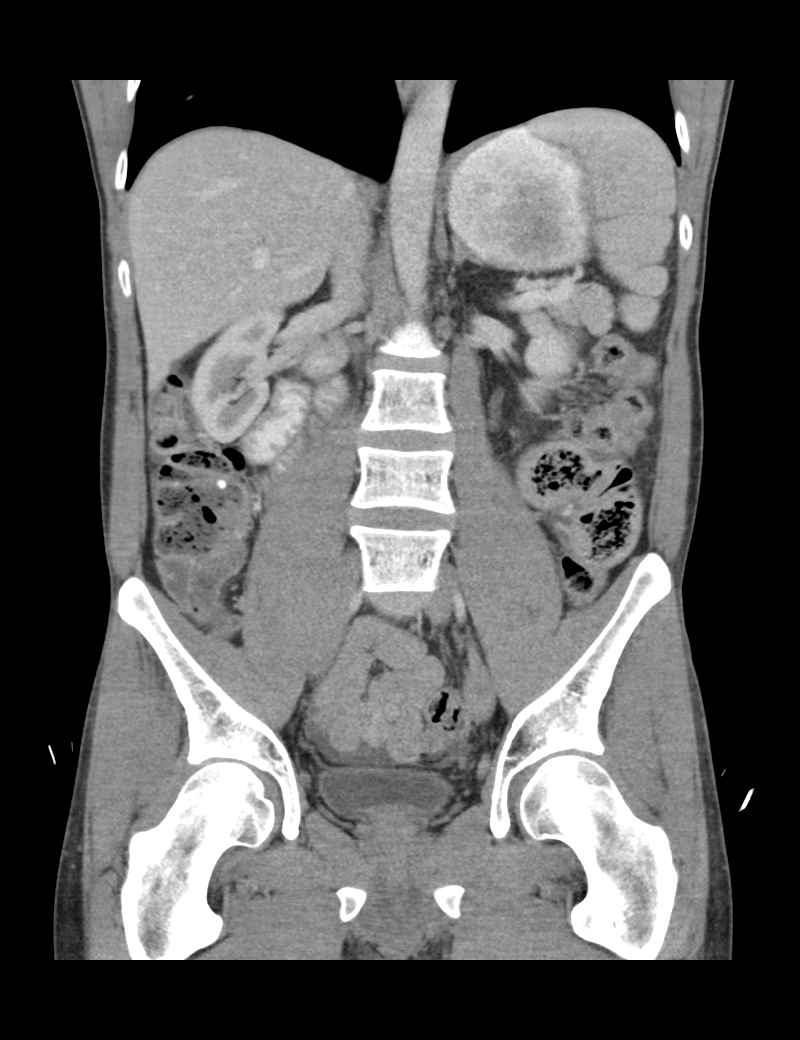
[im 53/68  soft-tissue]
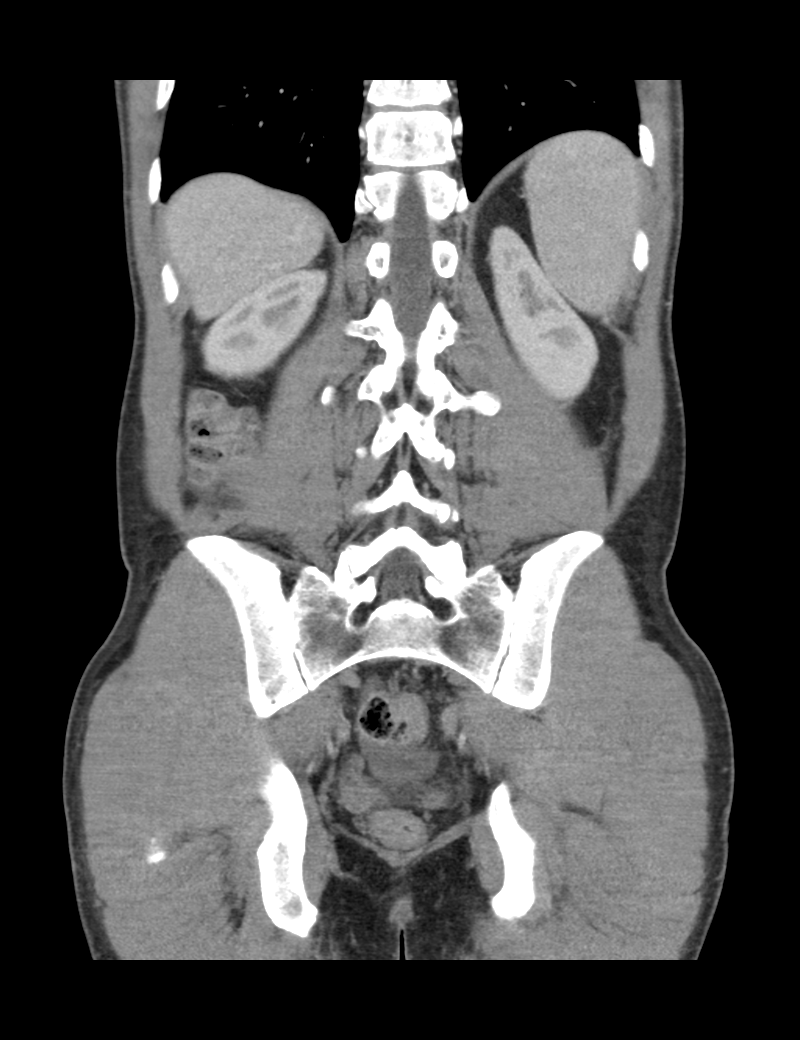

[Series 205: sag · sagittal · 0.50mm/px · 1 of 97 slices shown, 2 images]
[im 33/97  soft-tissue]
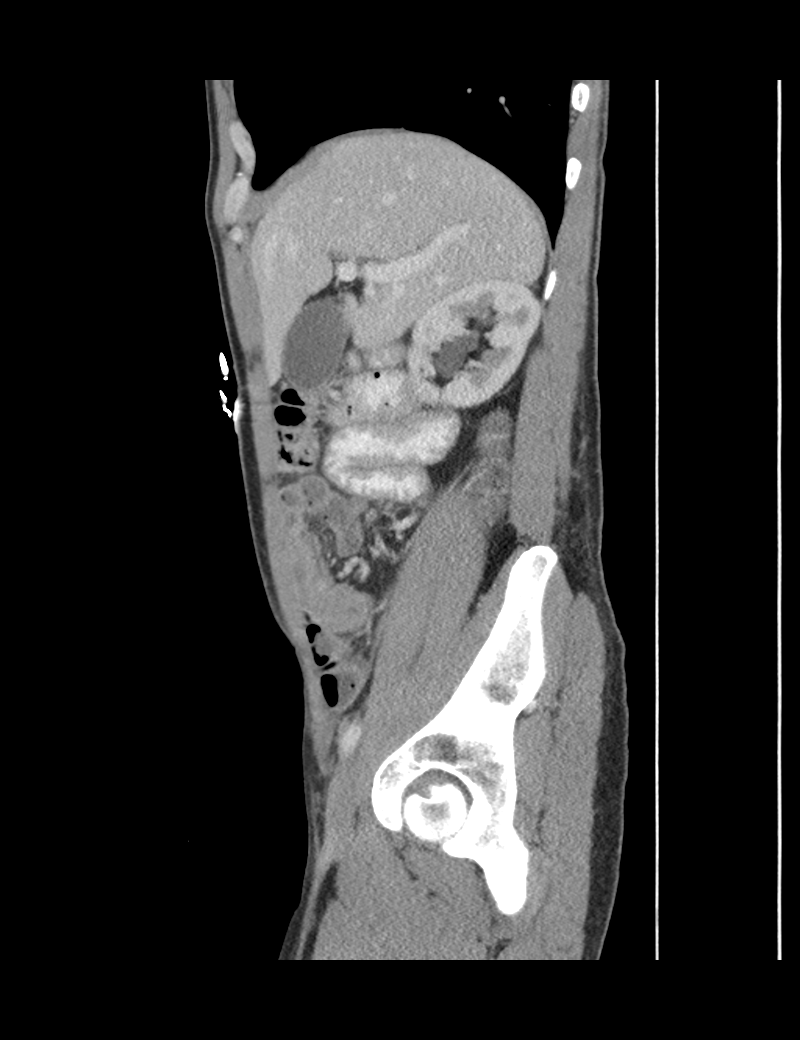
[im 33/97  bone]
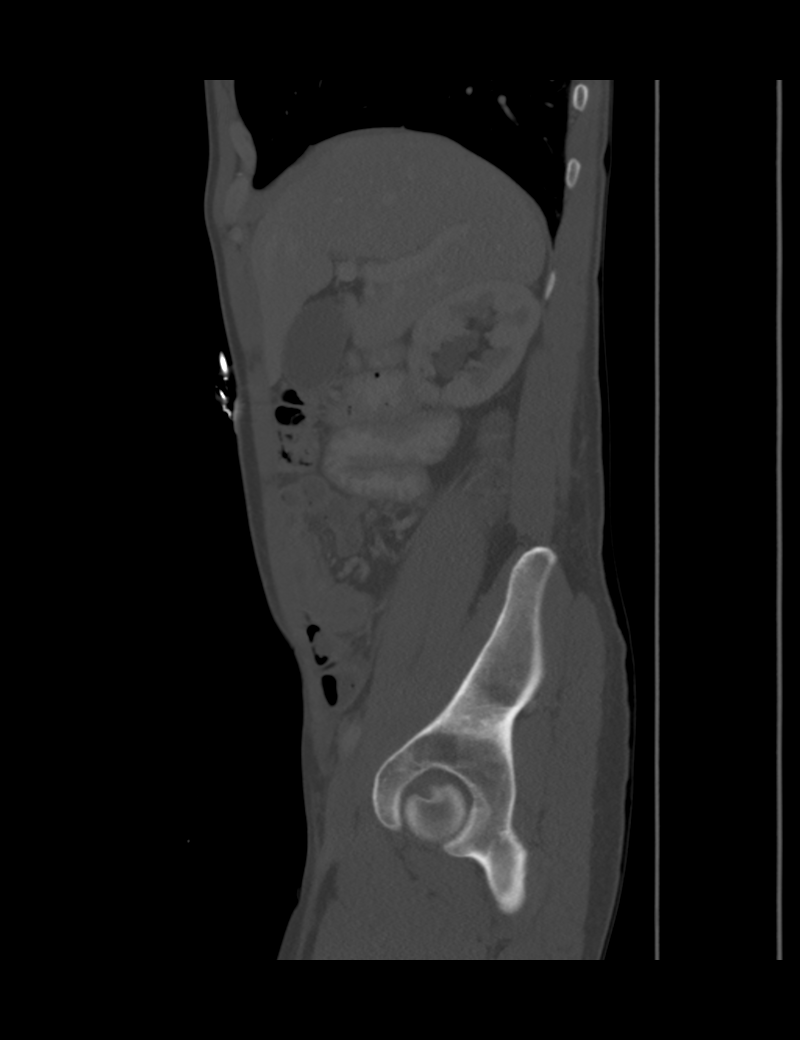

[4 of 46 positions shown; findings below may reference images not displayed]

FINDINGS: Mild subsegmental atelectasis seen dependently within the visualized
lung bases. No pleural or pericardial effusion.

Single 5 mm hypodensity within the right hepatic lobe is too small
the characterize by CT, but may represent a small cyst. The liver is
otherwise unremarkable. Gallbladder is within normal limits. No
biliary ductal dilatation. The spleen, adrenal glands, and pancreas
demonstrate a normal contrast enhanced appearance.

The kidneys are equal in size with symmetric enhancement. No
nephrolithiasis, hydronephrosis, or focal enhancing renal mass.

Stomach within normal limits with enteric contrast material and
ingested contents present within the gastric lumen. Circumferential
wall thickening with mild inflammatory stranding seen about several
prominent loops of jejunum in the left and central abdomen (series
204, image 26 on coronal sequence). Small amount of free fluid also
present within the adjacent left hemi abdomen. Fecalization of
enteric contents seen within this loop of bowel, suggesting slow
transit. Few mildly prominent loops of small bowel measuring up to 3
cm in diameter are present within the right hemi abdomen just
proximal to this (series 201, image 36). Distally, the ileum is
within normal limits. The colon is of normal caliber and appearance
without acute inflammatory changes. Appendix is within normal
limits.

Bladder is within normal limits.  Prostate are unremarkable.

Small volume free fluid present within the pelvis (series 201, image
69), which may be related to active inflammation or possibly recent
surgery.

No free intraperitoneal air.

No pathologically enlarged intra-abdominal or pelvic lymph nodes are
identified. Normal intravascular enhancement seen throughout the
abdomen and pelvis.

No acute osseous abnormality. No worrisome lytic or blastic osseous
lesions.
IMPRESSION: 1. Circumferential wall thickening with inflammatory stranding about
several prominent loops of jejunum in the left and central abdomen.
While this finding may be in part related to recent surgery, acute
enteritis could also have this appearance. Fecalization of enteric
contents within this loop of bowel with a few mildly prominent loops
of bowel proximally suggest slow transit. No overt evidence of bowel
obstruction at this time.
2. Small volume free fluid within the pelvis and left hemi abdomen,
which may be related to active inflammation or possibly recent
surgery.

## 2015-03-09 IMAGING — CR DG ABDOMEN 2V
2 series · 2 of 2 positions shown · non-contrast
Comparison: May 25, 2014 CT abdomen and pelvis

CLINICAL DATA: Abdominal pain

EXAM:
ABDOMEN - 2 VIEW

[w abdomen upright]
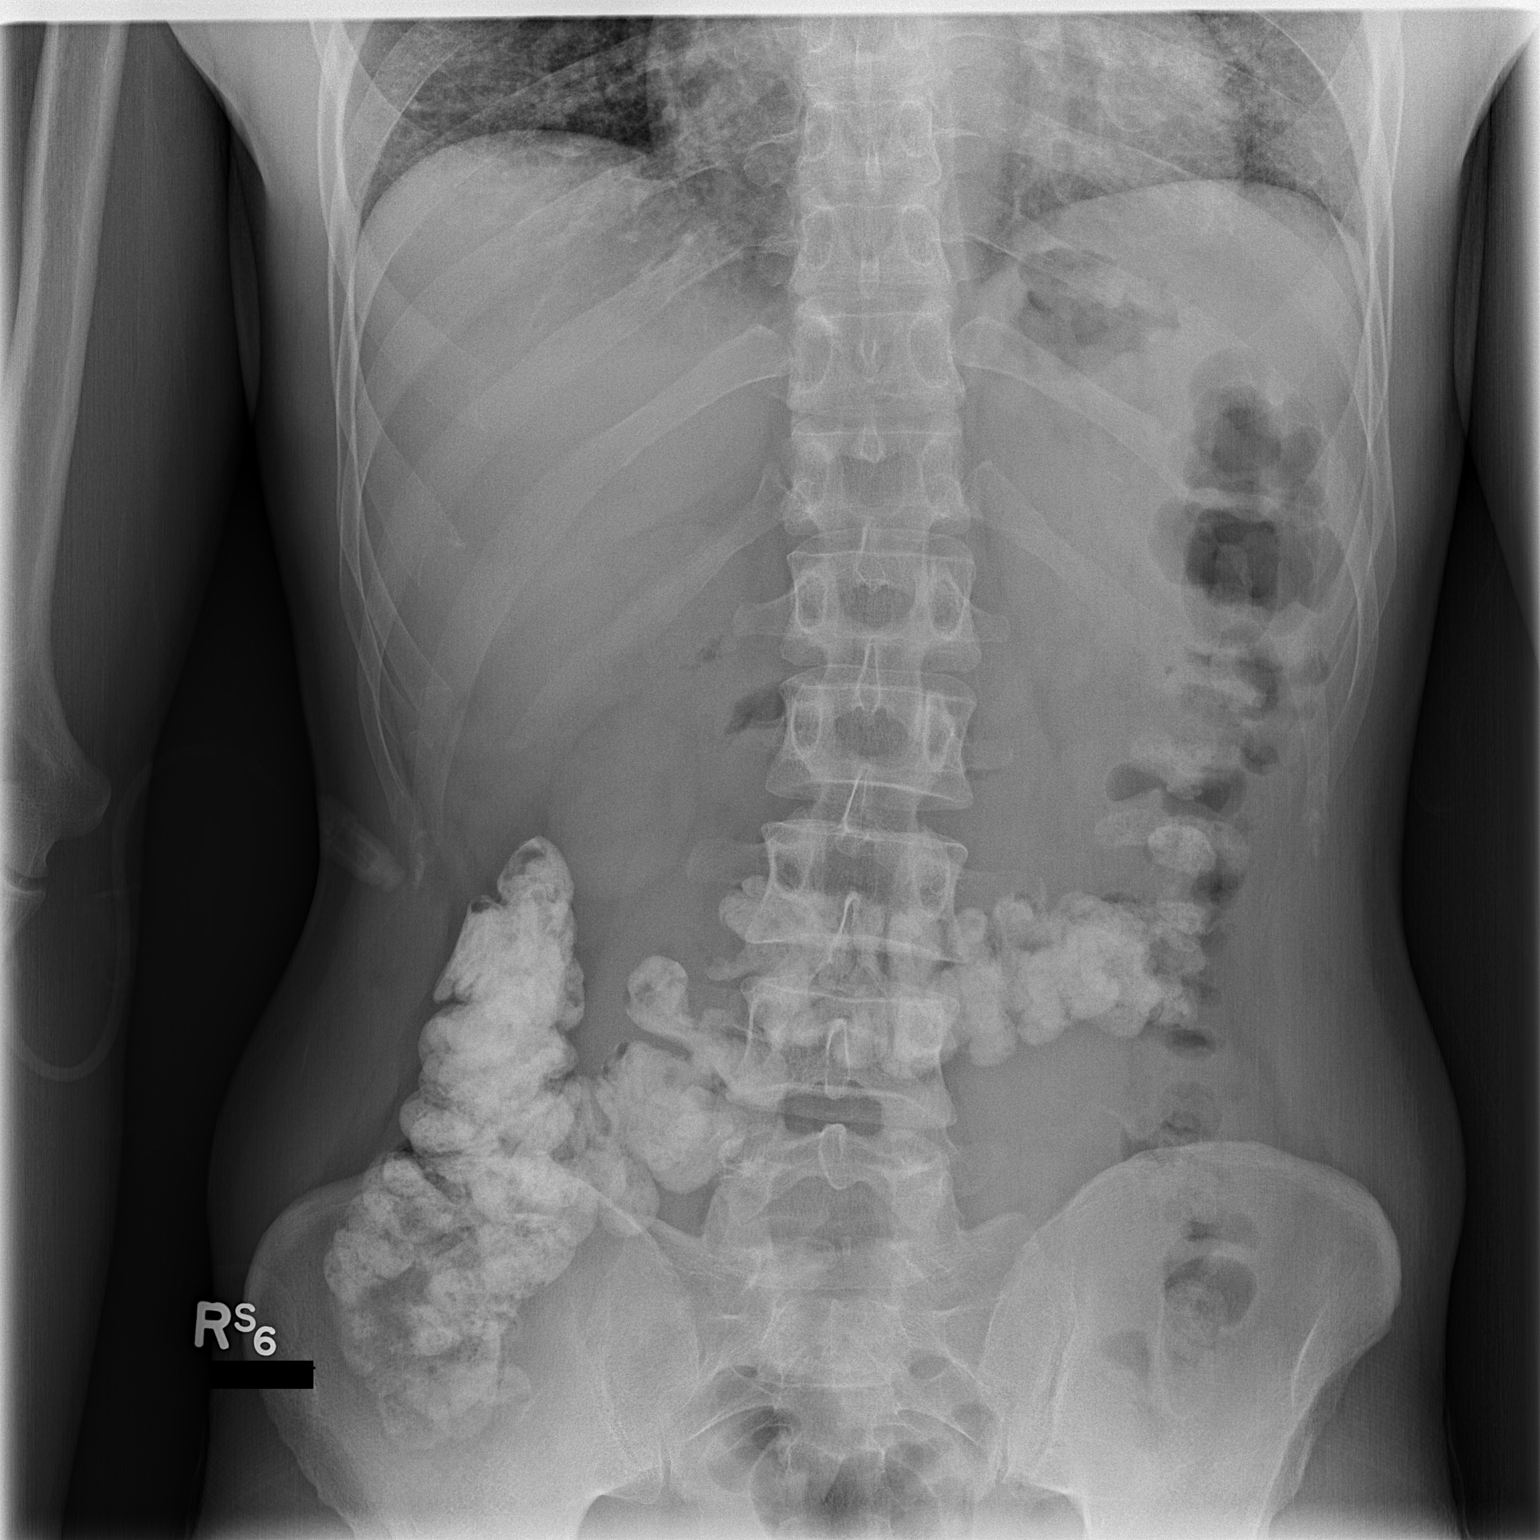

[t abdomen supine]
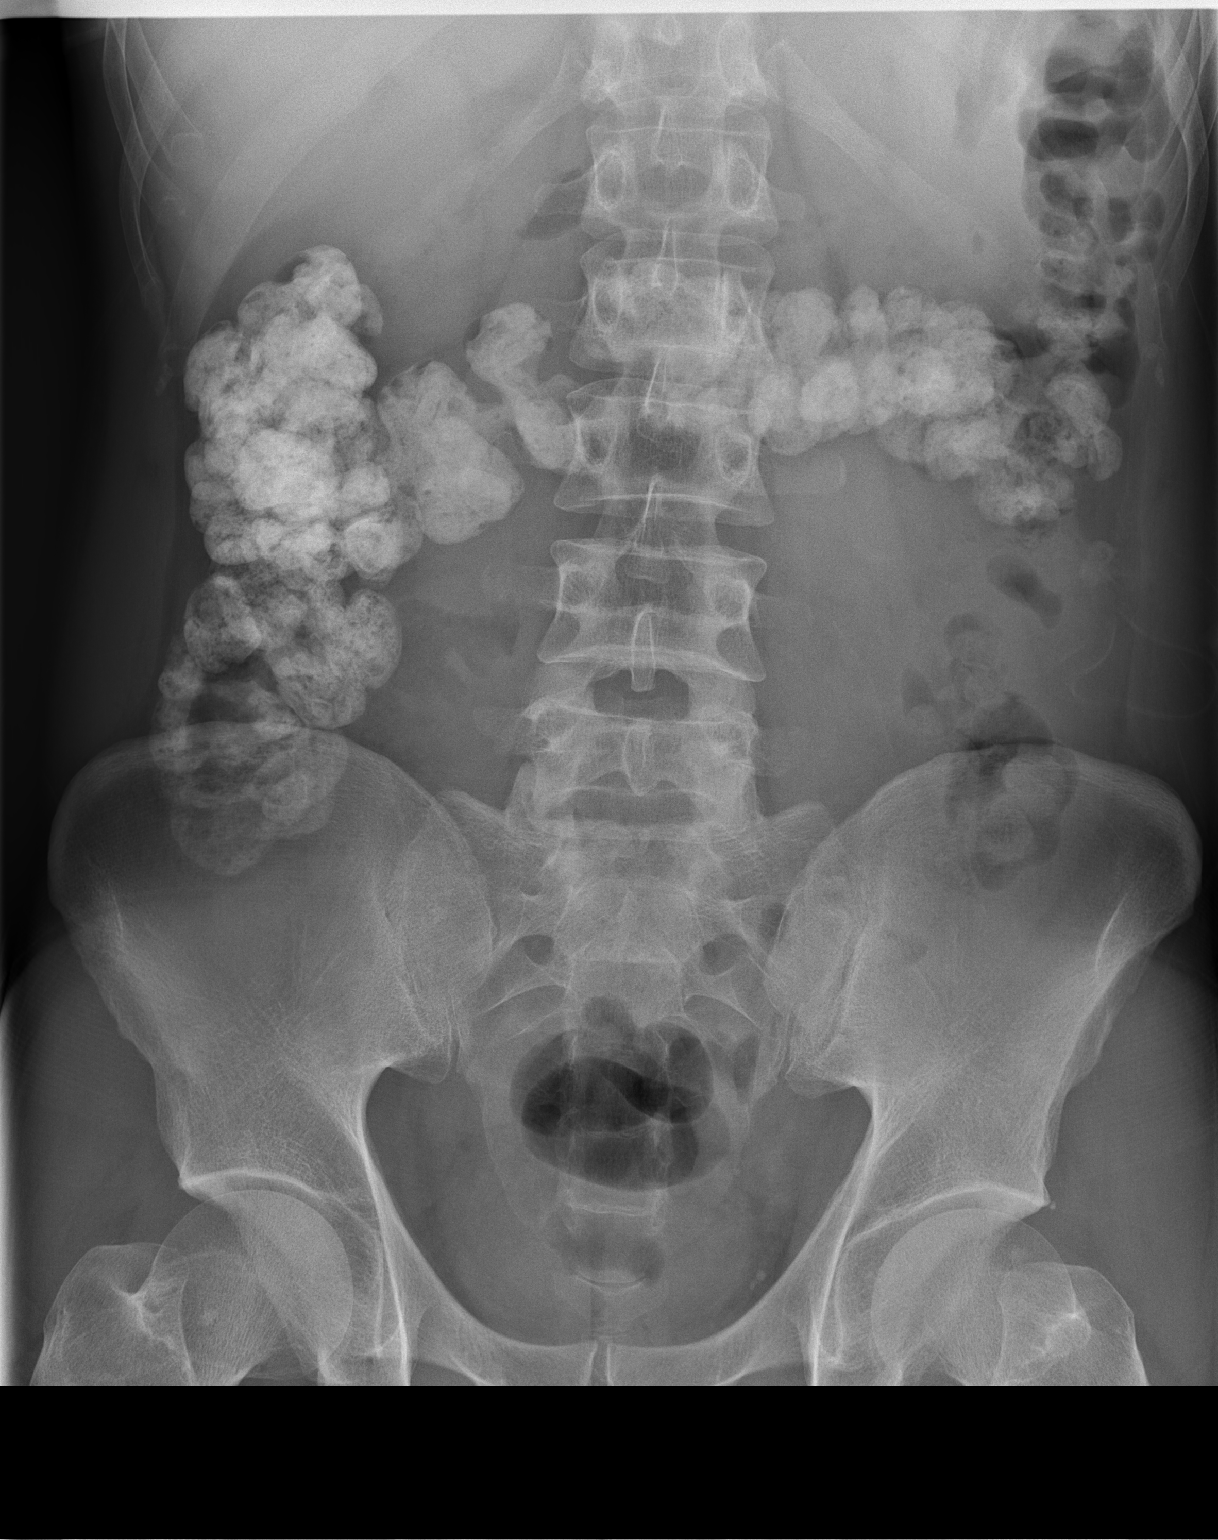

[2 of 2 positions shown; findings below may reference images not displayed]

FINDINGS: Supine and upright images were obtained. There is contrast in the
colon. The bowel gas pattern is unremarkable. No obstruction or free
air. There is moderate stool throughout the colon. There are
phleboliths in the pelvis.
IMPRESSION: Bowel gas pattern overall unremarkable.

## 2015-08-08 ENCOUNTER — Encounter: Payer: Self-pay | Admitting: Podiatry

## 2015-08-08 ENCOUNTER — Telehealth: Payer: Self-pay | Admitting: *Deleted

## 2015-08-08 ENCOUNTER — Ambulatory Visit (INDEPENDENT_AMBULATORY_CARE_PROVIDER_SITE_OTHER): Payer: 59

## 2015-08-08 ENCOUNTER — Other Ambulatory Visit: Payer: Self-pay | Admitting: Podiatry

## 2015-08-08 ENCOUNTER — Ambulatory Visit (INDEPENDENT_AMBULATORY_CARE_PROVIDER_SITE_OTHER): Payer: 59 | Admitting: Podiatry

## 2015-08-08 VITALS — BP 122/72 | HR 62 | Resp 18

## 2015-08-08 DIAGNOSIS — S90852A Superficial foreign body, left foot, initial encounter: Secondary | ICD-10-CM | POA: Diagnosis not present

## 2015-08-08 DIAGNOSIS — B078 Other viral warts: Secondary | ICD-10-CM

## 2015-08-08 DIAGNOSIS — B079 Viral wart, unspecified: Secondary | ICD-10-CM | POA: Diagnosis not present

## 2015-08-08 NOTE — Patient Instructions (Signed)

## 2015-08-08 NOTE — Progress Notes (Signed)
   Subjective:    Patient ID: Stephen [name], male    DOB: May 15, 1971, 44 y.o.   MRN: [id number]  HPI I HAVE A SPLINTER ON THE BOTTOM OF MY LEFT FOOT AND IT HAS BEEN THERE FOR ABOUT 3 WEEKS AND IS SORE AND TENDER AND I HAVE PICKED AT IT AND MY 5TH TOE ON MY LEFT HAS SOME FUNGUS ON IT AND HAS BEEN GOING ON FOR ABOUT 4 WEEKS AND I HAVE TRIED OVER THE COUNTER STUFF    Review of Systems  All other systems reviewed and are negative.      Objective:   Physical Exam        Assessment & Plan:

## 2015-08-08 NOTE — Telephone Encounter (Signed)
[place] [phone], was transferred to Customer Svc - Delsa Bern and he faxed a requistion form and sent a courrier to pick the specimen up from the [facility]ology box today.

## 2015-08-09 NOTE — Progress Notes (Signed)
Subjective:     Patient ID: Stephen [name], male   DOB: [birth year], 44 y.o.   MRN: [id number]  HPI patient points to the bottom of the heel and states he might have stepped on something or done something but it's been very tender for the last month or 2 and he's not sure which problem   Review of Systems  All other systems reviewed and are negative.      Objective:   Physical Exam  Constitutional: He is oriented to person, place, and time.  Cardiovascular: Intact distal pulses.   Musculoskeletal: Normal range of motion.  Neurological: He is oriented to person, place, and time.  Skin: Skin is warm.  Nursing note and vitals reviewed.  neurovascular status found to be intact with muscle strength adequate and range of motion within normal limits. Patient's noted to have a small mass plantar aspect left heel that is painful when pressed with a small core present. There is no indications of portal of entry and I noted no drainage. Good digital perfusion is noted with patient being well oriented 3     Assessment:     Appears to be some form of probable verruca or other soft tissue mass versus foreign body    Plan:     H&P and condition reviewed. X-ray was negative for any type of metallic or foreign body and at this time I did recommend excision with pathology. I did a proximal block of the area 60 mg Xylocaine with epinephrine and then excised the lesion and based on visual I do believe it will most likely be verruca plantaris. I applied a small amount of phenol to the base and sterile dressing and sent for pathology and patient will be seen back

## 2015-08-27 ENCOUNTER — Telehealth: Payer: Self-pay | Admitting: *Deleted

## 2015-08-27 NOTE — Telephone Encounter (Addendum)
Dr. Paulla Dolly reviewed biopsy pt's biopsy 08/08/2015 as plantar wart.  Left message for pt to call for results. 09/11/2015 - pt called for results, informed pt the biopsy was a plantar wart.

## 2015-10-30 ENCOUNTER — Ambulatory Visit: Payer: BLUE CROSS/BLUE SHIELD | Admitting: Sports Medicine

## 2015-10-31 ENCOUNTER — Ambulatory Visit (INDEPENDENT_AMBULATORY_CARE_PROVIDER_SITE_OTHER): Payer: BLUE CROSS/BLUE SHIELD | Admitting: Sports Medicine

## 2015-10-31 ENCOUNTER — Encounter: Payer: Self-pay | Admitting: Sports Medicine

## 2015-10-31 VITALS — BP 99/67 | Ht 69.0 in | Wt 147.0 lb

## 2015-10-31 DIAGNOSIS — M25511 Pain in right shoulder: Secondary | ICD-10-CM | POA: Diagnosis not present

## 2015-10-31 MED ORDER — MELOXICAM 15 MG PO TABS: ORAL_TABLET | ORAL | Status: DC

## 2015-10-31 NOTE — Patient Instructions (Signed)
I think you may have a small labral injury in your right shoulder  Take the meloxicam for one week and avoid any sort of exercise that puts pressure on the joint  Follow-up with me in 3 weeks. If you are no better I will start with doing an ultrasound.

## 2015-10-31 NOTE — Progress Notes (Signed)
   Subjective:    Patient ID: Stephen [name], male    DOB: 1971-09-13, 45 y.o.   MRN: [id number]  HPI chief complaint: Right shoulder pain  Very pleasant 45 year old right-hand-dominant male comes in today complaining of 3 weeks of right shoulder pain. No specific injury that he can recall but a gradual onset of pain that is diffuse throughout the shoulder. It is most noticeable when forcefully adducting his shoulder and arm against resistance. He also has some pain with reaching behind his back such as getting things out of the backseat. He denies any catching or popping. 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 He has been using nitroglycerin on his right shoulder for the past week but has not noticed any improvement in symptoms. He denies any lack of motion. He denies any weakness. No pain at night. No prior shoulder surgeries. 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 He denies numbness or tingling. No neck pain.  Past medical history reviewed Medications reviewed Allergies reviewed    Review of Systems    as above Objective:   Physical Exam  Well-developed, well-nourished. No acute distress. Fit-appearing. Vital signs reviewed  Right shoulder: Full painless range of motion. No tenderness to palpation along the clavicle nor over the [facility] joint. No tenderness over the bicipital groove. Rotator cuff strength is 5/5 bilaterally and does not reproduce pain. Negative O'Brien's. Negative clunk. Negative Hawkins, negative anti-can. Negative Spurling's. Neurovascularly intact distally.      Assessment & Plan:  3 weeks of right shoulder pain possibly secondary to labral irritation  I suspect that the upper body exercises that he has been doing in his [employer] have led to his current shoulder  pain. Given his fairly unremarkable physical exam and his history of a shoulder pain, I suspect that the labrum is his pain generator. He has excellent rotator cuff strength and no pain with rotator cuff stressing. I would like to place him on meloxicam 15 mg daily for the next 7 days and he will avoid any sort of upper extremity exercise that causes an axial load to the shoulder joint. Patient will follow-up with me in 3 weeks. If symptoms persist I would start with an ultrasound of his shoulder to rule out a rotator cuff tear. He will discontinue the nitroglycerin for now and will call with questions or concerns prior to his follow-up visit.

## 2015-11-21 ENCOUNTER — Ambulatory Visit: Payer: BLUE CROSS/BLUE SHIELD | Admitting: Sports Medicine

## 2015-11-21 ENCOUNTER — Encounter: Payer: Self-pay | Admitting: Sports Medicine

## 2015-11-21 VITALS — BP 114/68 | HR 61 | Ht 69.0 in | Wt 147.0 lb

## 2015-11-21 DIAGNOSIS — M25511 Pain in right shoulder: Secondary | ICD-10-CM

## 2015-11-21 NOTE — Progress Notes (Signed)
   Subjective:    Patient ID: Stephen [name], male    DOB: [birth year], 45 y.o.   MRN: [id number]  HPI   Patient comes in today for follow-up on right shoulder pain. Overall, he feels like his pain has improved by about 70% by avoiding those exercises that places an axial load on his shoulder joint. He still gets occasional twinges of pain primarily in the anterior shoulder which at times will radiate into his biceps. He is also getting some pain in his antecubital fossa. He is still taking his meloxicam. He is also starting to get similar pain in his left shoulder.    Review of Systems     Objective:   Physical Exam  Well-developed, fit appearing. No acute distress. Awake alert and oriented 3. Vital signs reviewed  Right shoulder: Full painless range of motion. No tenderness to palpation over the acromioclavicular joint nor over the bicipital groove. Patient has 5/5 strength with rotator cuff stressing. It is not painful. Negative O'Brien's. Negative clunk. Negative Hawkins, negative empty can.  Right elbow: Full range of motion. No effusion. He is tender to palpation deep in the antecubital fossa at the insertion of the distal biceps tendon. Mild pain with resisted supination. Good strength.  Neurovascularly intact distally.  [facility] ultrasound of his right shoulder was performed. Biceps tendon was well visualized within the bicipital groove without abnormality. Subscapularis, supraspinatus, and infraspinatus were all well-visualized without abnormality. There is some fluid within the subacromial bursa. Visualized portion of the posterior labrum was also unremarkable.      Assessment & Plan:  Improving right shoulder pain likely secondary to labral  Insertional biceps tendinitis, right elbow  I think the subacromial bursa swelling seen on ultrasound is an incidental finding. He does not demonstrate any signs of impingement or bursal irritation on physical exam. I do think that he has some  labral irritation in the shoulder and I would like for him to continue to avoid those exercises that places an axial load on his glenohumeral joint. He also has some insertional biceps tendinitis. I briefly discussed the possibility of physical therapy for this but he wants to hold on that for now. I would like for him to wean off of his meloxicam. If his shoulder pain worsens to the point that it is interfering with his quality of life enough that we need to investigate further, then I would consider an MRI arthrogram. We will schedule a follow-up appointment in 3 weeks but he may cancel that appointment if he continues to improve.

## 2015-12-12 ENCOUNTER — Ambulatory Visit: Payer: BLUE CROSS/BLUE SHIELD | Admitting: Sports Medicine

## 2017-01-12 ENCOUNTER — Ambulatory Visit (INDEPENDENT_AMBULATORY_CARE_PROVIDER_SITE_OTHER): Payer: BLUE CROSS/BLUE SHIELD | Admitting: Sports Medicine

## 2017-01-12 VITALS — BP 117/69 | Ht 69.0 in | Wt 148.0 lb

## 2017-01-12 DIAGNOSIS — G8929 Other chronic pain: Secondary | ICD-10-CM

## 2017-01-12 DIAGNOSIS — M25562 Pain in left knee: Secondary | ICD-10-CM

## 2017-01-12 DIAGNOSIS — M7631 Iliotibial band syndrome, right leg: Secondary | ICD-10-CM

## 2017-01-12 DIAGNOSIS — M7630 Iliotibial band syndrome, unspecified leg: Secondary | ICD-10-CM | POA: Insufficient documentation

## 2017-01-12 DIAGNOSIS — M25561 Pain in right knee: Secondary | ICD-10-CM | POA: Diagnosis not present

## 2017-01-12 NOTE — Progress Notes (Signed)
[name] Progress Note  Subjective:  Stephen [name] is a 46 y.o. male who presents for ongoing lower leg pain. Pain has been present for begins around lateral aspect of R knee when running but is more focused around proximal fibular head. He has similar symptoms on the left but is actually limited by his symptoms on the right, such that he does not feel the left side has a chance to get bad. He also complains of calf fatigue that is worse in the morning. Pain begins about 1-3 miles into a run. He participates in sprint triathlons and would like to be able to run 10k distance for these. He has been having to limit his running distance over the last few months due to pain and had to miss the first competition of the season. He denies swelling of knees. He has not been using NSAIDs or icing, as pain will go away as soon as he stops running. 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  ROS: No locking or catching of knee, no falls  Objective: Blood pressure 117/69, height 5\' 9"  (1.753 m), weight 148 lb (67.1 kg). Body mass index is 21.86 kg/m. Constitutional: Athletic-appearing male in NAD Pulmonary/Chest: No respiratory distress.   Musculoskeletal: No swelling of bilateral knees. Minimal varus deformity of legs. Gait observed and runs on forefoot with good form but does supinate. Minimal crepitus of knees bilaterally. No TTP over lateral or medial joint lines of knees. Mild point TTP with deep palpation a couple inches below proximal tibial bilaterally. Full flexion and extension at knee with intact strength. Hip abduction 4+/5. Negative Thessaly's.  Neurological: Sensation across LEs intact.  Skin: Skin is warm and dry. No rash noted. No erythema.  Psychiatric: Normal mood and affect.  Vitals reviewed  Assessment/Plan: Iliotibial band syndrome - Symptoms consistent with R > L  iliotibial band syndrome. Supinating gait likely contributing. - Recommended hip abductor strengthening exercises - Provided green sole inserts with lateral sole wedges - Recommended letting pain guide amount of running but to preferentially cross train  Follow-up in 6 weeks to reassess symptoms.  Olene Floss, MD [place], PGY-2  Patient seen and evaluated with the resident. I agree with the above plan of care. Patient will start daily hip abductor strengthening exercises and IT band stretches. We also put a green sports insole with a fifth ray post into his running shoes to help correct his supination. Patient will follow-up with me in 6 weeks for reevaluation. He is okay to continue running using pain as his guide. Call with questions or concerns prior to his follow-up visit.

## 2017-01-12 NOTE — Assessment & Plan Note (Signed)
-   Symptoms consistent with R > L iliotibial band syndrome. Supinating gait likely contributing. - Recommended hip abductor strengthening exercises - Provided green sole inserts with lateral sole wedges - Recommended letting pain guide amount of running but to preferentially cross train

## 2017-02-23 ENCOUNTER — Ambulatory Visit: Payer: BLUE CROSS/BLUE SHIELD | Admitting: Sports Medicine

## 2017-03-31 ENCOUNTER — Ambulatory Visit (INDEPENDENT_AMBULATORY_CARE_PROVIDER_SITE_OTHER): Payer: BLUE CROSS/BLUE SHIELD | Admitting: Sports Medicine

## 2017-03-31 VITALS — BP 116/78 | Ht 69.0 in | Wt 150.0 lb

## 2017-03-31 DIAGNOSIS — M79604 Pain in right leg: Secondary | ICD-10-CM | POA: Diagnosis not present

## 2017-03-31 NOTE — Progress Notes (Signed)
Chief complaint: Follow-up of right leg pain  History of present illness: Male is a 46 year old Caucasian male, past medical history notable for bladder outlet obstruction, who presents to the sports medicine office today for follow-up of right leg pain. In review, he was seen here back on 01/12/17 for right leg pain. He reported at that time of having right lower leg pain, occurring on lateral aspect of right knee, points to right fibular head has point of maximum pain. He reports that this occurs approximately 2 miles into his run, notices that much more the next morning when he wakes up. He reports the pain as a cramping pain and a tightness pain. He does not report of any other aggravating factors. He reports that he has not used any other medications or icing, as pain would subside after running. He does not report of any numbness or tingling in his right leg. It was suspected at that time that his symptoms were due to IT band syndrome, he was given home physical therapy exercises. On gait analysis previously, he was noted to be supinating and was striking with forefoot. Additionally, he was given green sole inserts with lateral sole wedges. He reports that he has had decrease in volume of his running. He reports that he is only running 3-4 times a week, running approximately 2-3 miles. He reports that he also has been doing mountain biking, swimming, and weight lifting. He reports still having some pain with running, but not with mountain biking, swimming, or weight lifting. He reports not noticing any difference with the orthotics. He reports that he has been doing home physical therapy exercises 3-4 times a week, his massage therapist has noticed strengthening in his hip abductors. 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  Review of systems: 12 point review of  systems negative unless stated otherwise in history of present illness  Past family surgical and social history reviewed, unchanged from previous office visit  Physical exam: Gen-alert, oriented, appears stated age, in no apparent distress HEENT-moist oral mucosa Respiratory-normal respirations, able to speak in full sentences Cardiac-normal peripheral pulses, regular rate Integumentary-no rashes on visible skin Neurologic-does have slight decrease in Achilles reflex right side compared to left side, additionally has weakness with right great toe extension on right side (subtle) Musculoskeletal Inspection of right lower leg does not reveal any obvious deformity or atrophy, no warmth, erythema, ecchymosis, or effusion noted, no tenderness to palpation and patella, quadriceps tendon, patellar tendon, medial or lateral joint line, slight tenderness to deep palpation at head of fibula, no pain in right hip or right ankle, anterior drawer, Lachman, valgus and varus stress tests negative, McMurray negative, patellar grind negative, strength 5/5 in bilateral lower extremity, sensation 2+ bilateral lower extremity, on gait analysis do notice that he does strike with his forefoot and does have slight supination landing on lateral aspect of feet  Assessment and plan: 1. 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  Patient seen and evaluated with the sports medicine fellow. I agree with the above plan of care. I question whether or not this patient's symptoms are the result of lumbar radiculopathy. His calf tightness and cramping fit the L5 nerve root distribution. He is not improving with treatment for IT band syndrome. I would like for the patient to try some physical therapy. He will work with Mali Parker. Follow-up with me in 4-6 weeks for reevaluation. If symptoms continue to worsen then we may need to consider repeat imaging of his lumbar spine. As noted above he does have a history of a small central disc protrusion at L5-S1 on MRI in 2008.

## 2017-09-18 ENCOUNTER — Other Ambulatory Visit: Payer: Self-pay

## 2017-09-18 ENCOUNTER — Emergency Department (HOSPITAL_COMMUNITY)
Admission: EM | Admit: 2017-09-18 | Discharge: 2017-09-18 | Disposition: A | Discharge status: Home / Self Care | Payer: BLUE CROSS/BLUE SHIELD | Attending: Emergency Medicine | Admitting: Emergency Medicine

## 2017-09-18 ENCOUNTER — Encounter (HOSPITAL_COMMUNITY): Payer: Self-pay

## 2017-09-18 DIAGNOSIS — R338 Other retention of urine: Secondary | ICD-10-CM

## 2017-09-18 DIAGNOSIS — Z79899 Other long term (current) drug therapy: Secondary | ICD-10-CM | POA: Insufficient documentation

## 2017-09-18 DIAGNOSIS — R339 Retention of urine, unspecified: Secondary | ICD-10-CM | POA: Insufficient documentation

## 2017-09-18 LAB — URINALYSIS, ROUTINE W REFLEX MICROSCOPIC
Bilirubin Urine: NEGATIVE
GLUCOSE, UA: NEGATIVE mg/dL
Hgb urine dipstick: NEGATIVE
KETONES UR: NEGATIVE mg/dL
Leukocytes, UA: NEGATIVE
Nitrite: NEGATIVE
PH: 7 (ref 5.0–8.0)
Protein, ur: NEGATIVE mg/dL
Specific Gravity, Urine: 1.01 (ref 1.005–1.030)

## 2017-09-18 MED ORDER — LIDOCAINE HCL 2 % EX GEL
1.0000 "application " | Freq: Once | CUTANEOUS | Status: AC
  Administered 2017-09-18: 1 via URETHRAL
  Filled 2017-09-18: qty 5

## 2017-09-18 NOTE — ED Provider Notes (Signed)
[facility] DEPT Provider Note   CSN: [id number] Arrival date & time: 09/18/17  1817     History   Chief Complaint Chief Complaint  Patient presents with  . Urinary Retention    HPI Jahmad Petrich is a 47 y.o. male.  The history is provided by the patient. No language interpreter was used.    Tionne Dayhoff is a 47 y.o. male who presents to the Emergency Department complaining of urinary retention .  He had an abdominoplasty and hernia repair in [place] performed yesterday.  He was discharged home and has had difficulty with urinary output since that time.  He has been able to have dribbling urination but not able to completely void.  He was last able to get a small amount of urine out 2 hours prior to ED arrival.  No fevers, nausea, vomiting.  He does have abdominal discomfort.  He has no medical problems and takes no routine medications.  History reviewed. No pertinent past medical history.  Patient Active Problem List   Diagnosis Date Noted  . Iliotibial band syndrome 01/12/2017  . Abdominal pain 05/25/2014  . Tendinopathy of left rotator cuff 01/03/2014  . Knee pain 11/17/2012  . Right knee pain 11/17/2012  . BLADDER OUTLET OBSTRUCTION 07/23/2010    Past Surgical History:  Procedure Laterality Date  . ABDOMINAL SURGERY    . HERNIA REPAIR         Home Medications    Prior to Admission medications   Medication Sig Start Date End Date Taking? Authorizing Provider  finasteride (PROSCAR) 5 MG tablet Take 1.25 mg by mouth 3 (three) times a week. On Monday, Wednesday and Friday.    [provider]  fluticasone (FLONASE) 50 MCG/ACT nasal spray Place 2 sprays into both nostrils daily.    [provider]  Melatonin 3 MG CAPS Take 3 mg by mouth at bedtime.    [provider]  meloxicam (MOBIC) 15 MG tablet Take one tablet a day for 7 days, then take as needed. Take with food 10/31/15   Lilia Argue R, DO    pantoprazole (PROTONIX) 40 MG tablet Take 1 tablet (40 mg total) by mouth daily. 05/26/14   Erby Pian, NP    Family History No family history on file.  Social History Social History   Tobacco Use  . Smoking status: Never Smoker  . Smokeless tobacco: Never Used  Substance Use Topics  . Alcohol use: Yes    Comment: 1  a day  . Drug use: No     Allergies   Aspirin   Review of Systems Review of Systems  All other systems reviewed and are negative.    Physical Exam Updated Vital Signs BP 126/78   Pulse 85   Temp 98.7 F (37.1 C) (Oral)   Resp 16   Ht 5\' 9"  (1.753 m)   Wt 70.3 kg (155 lb)   SpO2 98%   BMI 22.89 kg/m   Physical Exam  Constitutional: He is oriented to person, place, and time. He appears well-developed and well-nourished.  HENT:  Head: Normocephalic and atraumatic.  Cardiovascular: Normal rate and regular rhythm.  No murmur heard. Pulmonary/Chest: Effort normal and breath sounds normal. No respiratory distress.  Abdominal:  Midline surgical incision site with dressing in place that has a small amount of dried blood.  No significant erythema of the abdominal wall.  There is a surgical drain in the right lower quadrant with a small amount of serosanguineous  fluid.  Musculoskeletal: He exhibits no edema or tenderness.  Neurological: He is alert and oriented to person, place, and time.  Skin: Skin is warm and dry.  Psychiatric: He has a normal mood and affect. His behavior is normal.  Nursing note and vitals reviewed.    ED Treatments / Results  Labs (all labs ordered are listed, but only abnormal results are displayed) Labs Reviewed  URINALYSIS, ROUTINE W REFLEX MICROSCOPIC    EKG  EKG Interpretation None       Radiology No results found.  Procedures Procedures (including critical care time)  Medications Ordered in ED Medications  lidocaine (XYLOCAINE) 2 % jelly 1 application (1 application Urethral Given 09/18/17 1931)      Initial Impression / Assessment and Plan / ED Course  I have reviewed the triage vital signs and the nursing notes.  Pertinent labs & imaging results that were available during my care of the patient were reviewed by me and considered in my medical decision making (see chart for details).      Patient with acute urinary retention following outpatient surgery yesterday.  A Foley catheter was placed in the emergency department with return of 650 mL's of urine.  His symptoms were significantly improved following catheter placement.  UA not consistent with UTI.  Discussed with patient Foley catheter care, urology follow-up as well as return precautions.  Final Clinical Impressions(s) / ED Diagnoses   Final diagnoses:  Acute urinary retention    ED Discharge Orders    None       Quintella Reichert, MD 09/19/17 [id number]

## 2017-09-18 NOTE — ED Triage Notes (Signed)
Patient had a hernia repair and abdominoplasty yesterday. Patient states he urinated a small amount 2 hours ago , but has voided small amounts throughout the day.

## 2019-06-21 ENCOUNTER — Encounter: Payer: Self-pay | Admitting: Gastroenterology

## 2019-06-23 ENCOUNTER — Other Ambulatory Visit: Payer: Self-pay

## 2019-06-23 ENCOUNTER — Encounter: Payer: Self-pay | Admitting: Podiatry

## 2019-06-23 ENCOUNTER — Ambulatory Visit (INDEPENDENT_AMBULATORY_CARE_PROVIDER_SITE_OTHER): Payer: 59 | Admitting: Podiatry

## 2019-06-23 VITALS — BP 118/70

## 2019-06-23 DIAGNOSIS — B070 Plantar wart: Secondary | ICD-10-CM

## 2019-06-27 NOTE — Progress Notes (Signed)
Subjective:   Patient ID: Stephen [name], male   DOB: 48 y.o.   MRN: [id number]   HPI Patient presents with lesion bottom of the right foot that is been sore and it is been going on for most a year and he did have previous 1 removed years ago which is done well but this is been bothering him over that period of time even though it does not appear as thick and there is more of them   Review of Systems  All other systems reviewed and are negative.       Objective:  Physical Exam Vitals signs and nursing note reviewed.  Constitutional:      Appearance: He is well-developed.  Pulmonary:     Effort: Pulmonary effort is normal.  Musculoskeletal: Normal range of motion.  Skin:    General: Skin is warm.  Neurological:     Mental Status: He is alert.     Neurovascular status intact muscle strength found to be adequate range of motion within normal limits with patient noted to have multiple lesions plantar aspect right lateral forefoot measuring about a centimeter and a half by centimeter that upon debridement shows pinpoint bleeding pain to lateral pressure     Assessment:  Probable verruca plantaris plantar aspect right     Plan:  H&P reviewed condition sterile sharp debridement of lesions accomplished with no iatrogenic bleeding and then went ahead and I applied chemical agent to create immune response with sterile dressing instructed what to do if any blistering were to occur and reappoint 1 month to reevaluate

## 2019-07-21 ENCOUNTER — Other Ambulatory Visit: Payer: Self-pay

## 2019-07-21 ENCOUNTER — Encounter: Payer: Self-pay | Admitting: Podiatry

## 2019-07-21 ENCOUNTER — Ambulatory Visit (INDEPENDENT_AMBULATORY_CARE_PROVIDER_SITE_OTHER): Payer: 59 | Admitting: Podiatry

## 2019-07-21 DIAGNOSIS — B070 Plantar wart: Secondary | ICD-10-CM | POA: Diagnosis not present

## 2019-07-25 NOTE — Progress Notes (Signed)
Subjective:   Patient ID: Stephen [name], male   DOB: 48 y.o.   MRN: [id number]   HPI Patient states is feeling much better with mild discomfort still noted   ROS      Objective:  Physical Exam  Neurovascular status intact with lesions of plantar right foot that upon debridement still show pinpoint bleeding     Assessment:  Verruca plantaris present right     Plan:  Sterile sharp debridement accomplished with medication to create immune response applied with sterile dressing.  Reappoint to recheck

## 2019-07-29 ENCOUNTER — Other Ambulatory Visit: Payer: Self-pay

## 2019-07-29 ENCOUNTER — Ambulatory Visit (INDEPENDENT_AMBULATORY_CARE_PROVIDER_SITE_OTHER): Payer: 59 | Admitting: Gastroenterology

## 2019-07-29 ENCOUNTER — Encounter: Payer: Self-pay | Admitting: Gastroenterology

## 2019-07-29 VITALS — BP 118/72 | HR 66 | Temp 97.9°F | Ht 69.0 in | Wt 165.0 lb

## 2019-07-29 DIAGNOSIS — Z8371 Family history of colonic polyps: Secondary | ICD-10-CM | POA: Diagnosis not present

## 2019-07-29 DIAGNOSIS — Z1211 Encounter for screening for malignant neoplasm of colon: Secondary | ICD-10-CM

## 2019-07-29 DIAGNOSIS — Z800 Family history of malignant neoplasm of digestive organs: Secondary | ICD-10-CM

## 2019-07-29 NOTE — Patient Instructions (Signed)
High Risk Colonoscopy Screening-   Mother - history of colon polyps  Grandmother- history of colon cancer  It has been recommended to you by your physician that you have a(n) Colonoscopy completed. Per your request, we did not schedule the procedure(s) today. Please contact our office at [phone] should you decide to have the procedure completed. You will be scheduled for a pre-visit and procedure at that time.   If you are age 47 or younger, your body mass index should be between 19-25. Your Body mass index is 24.37 kg/m. If this is out of the aformentioned range listed, please consider follow up with your Primary Care Provider.   Thank you for choosing me and [facility].  Dr. Rush Landmark

## 2019-07-30 ENCOUNTER — Encounter: Payer: Self-pay | Admitting: Gastroenterology

## 2019-07-30 DIAGNOSIS — Z800 Family history of malignant neoplasm of digestive organs: Secondary | ICD-10-CM | POA: Insufficient documentation

## 2019-07-30 DIAGNOSIS — Z8371 Family history of colonic polyps: Secondary | ICD-10-CM | POA: Insufficient documentation

## 2019-07-30 DIAGNOSIS — Z1211 Encounter for screening for malignant neoplasm of colon: Secondary | ICD-10-CM | POA: Insufficient documentation

## 2019-07-30 NOTE — Progress Notes (Signed)
[place] VISIT   Primary Care Provider Stephen [name], [place] [address] [phone]  Referring Provider Stephen Pick, MD [address] [place],  [place] [address] [phone]  Patient Profile: Stephen [name] is a 48 y.o. male with a pmh significant for family history colon cancer (MGM), family history of colon polyps (Mother), BPH.  The patient presents to the [facility] for an evaluation and management of problem(s) noted below:  Problem List 1. Family hx of colon cancer   2. Family history of colonic polyps   3. Colon cancer screening     History of Present Illness This is the patient's first visit to the outpatient [place] clinic.  The patient presents for evaluation and consideration of high risk colon cancer screening.  His family history is significant for a maternal grandmother with colon cancer as well as a mother with colon polyps beginning in her early 70s.  Unfortunately, we do not know the exact type of polyps that the patient's mother has had but she has had multiple colonoscopies.  She also deals with issues of diverticulosis as well as potential diverticulitis per patient report.  The patient denies any other significant GI symptoms.  He denies any changes in his bowel habits.  He tries to maintain a good fiber diet and has bowel movements 1-2 times daily.  There is no evidence of any blood in his stools at any time point.  He is not clear that he is ever felt or thinks that he has hemorrhoids.  He has never had an upper or lower endoscopic evaluation.  He is interested in colon cancer screening due to his history as noted above and his wife just underwent a recent colonoscopy (I performed this within the last few weeks).  GI Review of Systems Positive as above Negative for pyrosis, dysphagia, odynophagia, nausea, vomiting, abdominal pain, change in bowel habits, melena,  hematochezia  Review of Systems General: Denies fevers/chills/weight loss HEENT: Denies oral lesions Cardiovascular: Denies chest pain Pulmonary: Denies shortness of breath Gastroenterological: See HPI Genitourinary: Denies darkened urine Hematological: Denies easy bruising/bleeding Endocrine: Denies temperature intolerance Dermatological: Denies jaundice Psychological: Mood is stable   Medications Current Outpatient Medications  Medication Sig Dispense Refill   finasteride (PROSCAR) 5 MG tablet Take 1.25 mg by mouth 3 (three) times a week. On Monday, Wednesday and Friday.     fluticasone (FLONASE) 50 MCG/ACT nasal spray Place 2 sprays into both nostrils daily as needed.      Melatonin 3 MG CAPS Take 3 mg by mouth at bedtime.     No current facility-administered medications for this visit.     Allergies Allergies  Allergen Reactions   Aspirin Hives    Histories History reviewed. No pertinent past medical history. Past Surgical History:  Procedure Laterality Date   ABDOMINAL SURGERY  2015   small bowel hernia repair   NASAL SINUS SURGERY     times 2   Social History   Socioeconomic History   Marital status: Married    Spouse name: Not on file   Number of children: Not on file   Years of education: Not on file   Highest education level: Not on file  Occupational History   Not on file  Social Needs   Financial resource strain: Not on file   Food insecurity    Worry: Not on file    Inability: Not on file   Transportation needs  Medical: Not on file    Non-medical: Not on file  Tobacco Use   Smoking status: Never Smoker   Smokeless tobacco: Never Used  Substance and Sexual Activity   Alcohol use: Yes    Comment: 1  a day   Drug use: No   Sexual activity: Not on file  Lifestyle   Physical activity    Days per week: Not on file    Minutes per session: Not on file   Stress: Not on file  Relationships   Social connections     Talks on phone: Not on file    Gets together: Not on file    Attends religious service: Not on file    Active member of club or organization: Not on file    Attends meetings of clubs or organizations: Not on file    Relationship status: Not on file   Intimate partner violence    Fear of current or ex partner: Not on file    Emotionally abused: Not on file    Physically abused: Not on file    Forced sexual activity: Not on file  Other Topics Concern   Not on file  Social History Narrative   Not on file   Family History  Problem Relation Age of Onset   Colon polyps Mother    Diverticulitis Mother    ALS Father    Colon cancer Maternal Grandmother        around 25   Esophageal cancer Neg Hx    Inflammatory bowel disease Neg Hx    Liver disease Neg Hx    Pancreatic cancer Neg Hx    Stomach cancer Neg Hx    I have reviewed his medical, social, and family history in detail and updated the electronic medical record as necessary.    PHYSICAL EXAMINATION  BP 118/72    Pulse 66    Temp 97.9 F (36.6 C)    Ht 5\' 9"  (1.753 m)    Wt 165 lb (74.8 kg)    BMI 24.37 kg/m  Wt Readings from Last 3 Encounters:  07/29/19 165 lb (74.8 kg)  09/18/17 155 lb (70.3 kg)  03/31/17 150 lb (68 kg)  GEN: NAD, appears stated age, doesn't appear chronically ill PSYCH: Cooperative, without pressured speech EYE: Conjunctivae pink, sclerae anicteric ENT: MMM NECK: Supple CV: RR without R/Gs  RESP: CTAB posteriorly, without wheezing GI: NABS, soft, NT/ND, without rebound or guarding, no HSM appreciated MSK/EXT: No lower extremity edema SKIN: No jaundice NEURO:  Alert & Oriented x 3, no focal deficits   REVIEW OF DATA  I reviewed the following data at the time of this encounter:  GI Procedures and Studies  No relevant studies to review  Laboratory Studies  No relevant laboratories to review in epic or Care Everywhere  Imaging Studies  No relevant studies to review   ASSESSMENT   Stephen [name] is a 48 y.o. male with a pmh significant for family history colon cancer (MGM), family history of colon polyps (Mother), BPH.  The patient is seen today for evaluation and management of:  1. Family hx of colon cancer   2. Family history of colonic polyps   3. Colon cancer screening    Thankfully, the patient is hemodynamically and clinically stable.  He does give a significant family history for colon polyps in his mother starting in her 90s and for colon cancer in his maternal grandmother.  [facility] [employer] has suggested beginning colon cancer screening  at age 54.  The in progress USPTF colon cancer screening guidelines are suggesting a push towards changing the age of average risk individuals to the age of 45.  I believe Mr. Thane has a higher than average risk due to his family history as noted above.  I think he would be a good candidate to begin colon cancer screening and evaluate for colon polyps due to the history as noted above.  He is going to reach out to his insurance company to ensure that they would approve moving forward with his colonoscopy.  If there are issues then we would consider the role of waiting in 11/05/2019 when the new USPTF guidelines are formally approved to began colon cancer screening at age 69 at which point he would be behind.  He will call and let us know within the next couple of weeks.  Thankfully, he is asymptomatic and so we do have time should we not be able to pursue his colonoscopy this year.  He offered or wanted to know what it may cost if he were to do the procedure without insurance and that is something that we can work on if he is interested and if he desires.  The risks and benefits of endoscopic evaluation were discussed with the patient; these include but are not limited to the risk of perforation, infection, bleeding, missed lesions, lack of diagnosis, severe illness requiring hospitalization, as well as anesthesia and sedation related  illnesses.  The patient is agreeable to proceed once he has had an opportunity to talk with his insurance company.  All patient questions were answered, to the best of my ability, and the patient agrees to the aforementioned plan of action with follow-up as indicated.   PLAN  Patient with family history of colon cancer and colon polyps and would benefit from increased risk colon cancer screening Patient will call us within the next couple of weeks to let us know his final decision on moving forward or not If issues with having this approved would reevaluate in a few months and to 2021 when the final USPTF guidelines are hopefully approved decreasing the age of colon cancer screening to begin at age 39   No orders of the defined types were placed in this encounter.   New Prescriptions   No medications on file   Modified Medications   No medications on file    Planned Follow Up No follow-ups on file.   Justice Britain, MD [facility] # [phone]

## 2019-09-14 ENCOUNTER — Other Ambulatory Visit: Payer: Self-pay

## 2019-09-14 ENCOUNTER — Ambulatory Visit (INDEPENDENT_AMBULATORY_CARE_PROVIDER_SITE_OTHER): Payer: 59 | Admitting: Podiatry

## 2019-09-14 ENCOUNTER — Encounter: Payer: Self-pay | Admitting: Podiatry

## 2019-09-14 DIAGNOSIS — B070 Plantar wart: Secondary | ICD-10-CM | POA: Diagnosis not present

## 2019-09-14 MED ORDER — FLUOROURACIL 5 % EX CREA: TOPICAL_CREAM | Freq: Two times a day (BID) | CUTANEOUS | 2 refills | Status: DC

## 2019-09-14 NOTE — Progress Notes (Signed)
Subjective:   Patient ID: Stephen [name], male   DOB: 48 y.o.   MRN: [id number]   HPI Patient states the area is not completely resolved but it seems to be doing fine   ROS      Objective:  Physical Exam  Neurovascular status intact with patient's right foot showing a continued massive tissue that upon debridement shows pinpoint bleeding and multiple small lesions      Assessment:  Verruca plantaris plantar right that so far has not responded conservatively     Plan:  Sharp sterile debridement of all area done and I went ahead and applied chemical agent to create immune response and discussed if this does not solve the problem that we are going to consider using laser for this.  Also wrote prescription for Efudex to begin and reappoint 2 months if symptoms persist

## 2019-09-16 HISTORY — PX: COLONOSCOPY: SHX174

## 2020-05-18 ENCOUNTER — Encounter: Payer: Self-pay | Admitting: Gastroenterology

## 2020-07-12 ENCOUNTER — Ambulatory Visit (AMBULATORY_SURGERY_CENTER): Payer: Self-pay

## 2020-07-12 ENCOUNTER — Other Ambulatory Visit: Payer: Self-pay

## 2020-07-12 VITALS — Ht 69.0 in | Wt 161.0 lb

## 2020-07-12 DIAGNOSIS — Z800 Family history of malignant neoplasm of digestive organs: Secondary | ICD-10-CM

## 2020-07-12 DIAGNOSIS — Z1211 Encounter for screening for malignant neoplasm of colon: Secondary | ICD-10-CM

## 2020-07-12 NOTE — Progress Notes (Signed)
No egg or soy allergy known to patient  No issues with past sedation with any surgeries or procedures No intubation problems in the past  No FH of Malignant Hyperthermia No diet pills per patient No home 02 use per patient  No blood thinners per patient  Pt denies issues with constipation  No A fib or A flutter  EMMI video via [place] 19 guidelines implemented in PV today with Pt and RN  COVID vaccines completed on 10/2019 per pt;  Due to the COVID-19 pandemic we are asking patients to follow these guidelines. Please only bring one care partner. Please be aware that your care partner may wait in the car in the parking lot or if they feel like they will be too hot to wait in the car, they may wait in the lobby on the 4th floor. All care partners are required to wear a mask the entire time (we do not have any that we can provide them), they need to practice social distancing, and we will do a Covid check for all patient's and care partners when you arrive. Also we will check their temperature and your temperature. If the care partner waits in their car they need to stay in the parking lot the entire time and we will call them on their cell phone when the patient is ready for discharge so they can bring the car to the front of the building. Also all patient's will need to wear a mask into building.

## 2020-07-17 ENCOUNTER — Encounter: Payer: Self-pay | Admitting: Gastroenterology

## 2020-07-26 ENCOUNTER — Encounter: Payer: Self-pay | Admitting: Gastroenterology

## 2020-07-26 ENCOUNTER — Ambulatory Visit (AMBULATORY_SURGERY_CENTER): Payer: 59 | Admitting: Gastroenterology

## 2020-07-26 ENCOUNTER — Other Ambulatory Visit: Payer: Self-pay

## 2020-07-26 VITALS — BP 101/68 | HR 63 | Temp 96.6°F | Resp 10 | Ht 69.0 in | Wt 161.0 lb

## 2020-07-26 DIAGNOSIS — D122 Benign neoplasm of ascending colon: Secondary | ICD-10-CM

## 2020-07-26 DIAGNOSIS — Z800 Family history of malignant neoplasm of digestive organs: Secondary | ICD-10-CM | POA: Diagnosis not present

## 2020-07-26 DIAGNOSIS — Z1211 Encounter for screening for malignant neoplasm of colon: Secondary | ICD-10-CM | POA: Diagnosis present

## 2020-07-26 DIAGNOSIS — K635 Polyp of colon: Secondary | ICD-10-CM | POA: Diagnosis not present

## 2020-07-26 MED ORDER — SODIUM CHLORIDE 0.9 % IV SOLN: 500.0000 mL | Freq: Once | INTRAVENOUS | Status: DC

## 2020-07-26 NOTE — Progress Notes (Signed)
Report given to PACU, vss 

## 2020-07-26 NOTE — Op Note (Signed)
[facility] Patient Name: Stephen [name] Procedure Date: 07/26/2020 8:37 AM MRN: [id number] Endoscopist: Gabriel Mansouraty , MD Age: 49 Referring MD:  Date of Birth: 05/27/1971 Gender: Male Account #: [id number] Procedure:                Colonoscopy Indications:              Screening for colorectal malignant neoplasm, This                            is the patient's first colonoscopy, Single MGM with                            Colon Cancer Medicines:                Monitored Anesthesia Care Procedure:                Pre-Anesthesia Assessment:                           - Prior to the procedure, a History and Physical                            was performed, and patient medications and                            allergies were reviewed. The patient's tolerance of                            previous anesthesia was also reviewed. The risks                            and benefits of the procedure and the sedation                            options and risks were discussed with the patient.                            All questions were answered, and informed consent                            was obtained. Prior Anticoagulants: The patient has                            taken no previous anticoagulant or antiplatelet                            agents. ASA Grade Assessment: I - A normal, healthy                            patient. After reviewing the risks and benefits,                            the patient was deemed in satisfactory condition to                              undergo the procedure.                           After obtaining informed consent, the colonoscope                            was passed under direct vision. Throughout the                            procedure, the patient's blood pressure, pulse, and                            oxygen saturations were monitored continuously. The                            Colonoscope was introduced through the anus and                             advanced to the the cecum, identified by the                            ileocecal valve. The colonoscopy was performed                            without difficulty. The patient tolerated the                            procedure. The quality of the bowel preparation was                            good. The terminal ileum, ileocecal valve,                            appendiceal orifice, and rectum were photographed. Scope In: 8:44:44 AM Scope Out: 9:01:03 AM Scope Withdrawal Time: 0 hours 12 minutes 32 seconds  Total Procedure Duration: 0 hours 16 minutes 19 seconds  Findings:                 The digital rectal exam findings include                            hemorrhoids. Pertinent negatives include no                            palpable rectal lesions.                           The terminal ileum and ileocecal valve appeared                            normal.                           A 2 mm polyp was found in the ascending colon. The                              polyp was sessile. The polyp was removed with a                            cold snare. Resection and retrieval were complete.                           Normal mucosa was found in the entire colon                            otherwise.                           Non-bleeding non-thrombosed internal hemorrhoids                            were found during retroflexion, during perianal                            exam and during digital exam. The hemorrhoids were                            Grade II (internal hemorrhoids that prolapse but                            reduce spontaneously). Complications:            No immediate complications. Estimated Blood Loss:     Estimated blood loss was minimal. Impression:               - Hemorrhoids found on digital rectal exam.                           - The examined portion of the ileum was normal.                           - One 2 mm polyp in the ascending colon, removed                             with a cold snare. Resected and retrieved.                           - Normal mucosa in the entire examined colon                            otherwise.                           - Non-bleeding non-thrombosed internal hemorrhoids. Recommendation:           - The patient will be observed post-procedure,                            until all discharge criteria are met.                           - Discharge patient to home.                           -   Patient has a contact number available for                            emergencies. The signs and symptoms of potential                            delayed complications were discussed with the                            patient. Return to normal activities tomorrow.                            Written discharge instructions were provided to the                            patient.                           - High fiber diet.                           - Use FiberCon 1-2 tablets PO daily.                           - Continue present medications.                           - Await pathology results.                           - Repeat colonoscopy in 7/10 years for surveillance                            based on pathology results and findings of                            adenomatous tissue.                           - The findings and recommendations were discussed                            with the patient. Justice Britain, MD 07/26/2020 9:06:04 AM

## 2020-07-26 NOTE — Progress Notes (Signed)
Called to room to assist during endoscopic procedure.  Patient ID and intended procedure confirmed with present staff. Received instructions for my participation in the procedure from the performing physician.  

## 2020-07-26 NOTE — Patient Instructions (Signed)
Impression/Recommendations:  Polyp, hemorrhoid, and High fiber diet handouts given to patient.  Use FiberCon 1-2 tablets by mouth daily. Continue present medications. High fiber diet. Await pathology results.  Repeat colonoscopy in surveillance in 7-10 years.  Date to be determined after pathology results reviewed.  YOU HAD AN ENDOSCOPIC PROCEDURE TODAY AT [facility]:   Refer to the procedure report that was given to you for any specific questions about what was found during the examination.  If the procedure report does not answer your questions, please call your gastroenterologist to clarify.  If you requested that your care partner not be given the details of your procedure findings, then the procedure report has been included in a sealed envelope for you to review at your convenience later.  YOU SHOULD EXPECT: Some feelings of bloating in the abdomen. Passage of more gas than usual.  Walking can help get rid of the air that was put into your GI tract during the procedure and reduce the bloating. If you had a lower endoscopy (such as a colonoscopy or flexible sigmoidoscopy) you may notice spotting of blood in your stool or on the toilet paper. If you underwent a bowel prep for your procedure, you may not have a normal bowel movement for a few days.  Please Note:  You might notice some irritation and congestion in your nose or some drainage.  This is from the oxygen used during your procedure.  There is no need for concern and it should clear up in a day or so.  SYMPTOMS TO REPORT IMMEDIATELY:   Following lower endoscopy (colonoscopy or flexible sigmoidoscopy):  Excessive amounts of blood in the stool  Significant tenderness or worsening of abdominal pains  Swelling of the abdomen that is new, acute  Fever of 100F or higher  For urgent or emergent issues, a gastroenterologist can be reached at any hour by calling [phone]. Do not use MyChart messaging for urgent  concerns.    DIET:  We do recommend a small meal at first, but then you may proceed to your regular diet.  Drink plenty of fluids but you should avoid alcoholic beverages for 24 hours.  ACTIVITY:  You should plan to take it easy for the rest of today and you should NOT DRIVE or use heavy machinery until tomorrow (because of the sedation medicines used during the test).    FOLLOW UP: Our staff will call the number listed on your records 48-72 hours following your procedure to check on you and address any questions or concerns that you may have regarding the information given to you following your procedure. If we do not reach you, we will leave a message.  We will attempt to reach you two times.  During this call, we will ask if you have developed any symptoms of COVID 19. If you develop any symptoms (ie: fever, flu-like symptoms, shortness of breath, cough etc.) before then, please call [phone].  If you test positive for Covid 19 in the 2 weeks post procedure, please call and report this information to [place].    If any biopsies were taken you will be contacted by phone or by letter within the next 1-3 weeks.  Please call us at [phone] if you have not heard about the biopsies in 3 weeks.    SIGNATURES/CONFIDENTIALITY: You and/or your care partner have signed paperwork which will be entered into your electronic medical record.  These signatures attest to the fact that that the information  above on your After Visit Summary has been reviewed and is understood.  Full responsibility of the confidentiality of this discharge information lies with you and/or your care-partner. 

## 2020-07-26 NOTE — Progress Notes (Signed)
Vitals-WR  Pt's states no medical or surgical changes since previsit or office visit.

## 2020-07-30 ENCOUNTER — Telehealth: Payer: Self-pay | Admitting: *Deleted

## 2020-07-30 NOTE — Telephone Encounter (Signed)
First follow up call made, left message. 

## 2020-07-30 NOTE — Telephone Encounter (Signed)
°  Follow up Call-  Call back number 07/26/2020  Post procedure Call Back phone  # [phone]  Permission to leave phone message Yes  Some recent data might be hidden     Patient questions:  Do you have a fever, pain , or abdominal swelling? No. Pain Score  0 *  Have you tolerated food without any problems? Yes.    Have you been able to return to your normal activities? Yes.    Do you have any questions about your discharge instructions: Diet   No. Medications  No. Follow up visit  No.  Do you have questions or concerns about your Care? No.  Actions: * If pain score is 4 or above: No action needed, pain <4.  1. Have you developed a fever since your procedure? no  2.   Have you had an respiratory symptoms (SOB or cough) since your procedure? no  3.   Have you tested positive for COVID 19 since your procedure no  4.   Have you had any family members/close contacts diagnosed with the COVID 19 since your procedure?  no   If yes to any of these questions please route to Joylene John, RN and Joella Prince, RN

## 2020-08-01 ENCOUNTER — Encounter: Payer: Self-pay | Admitting: Gastroenterology

## 2020-09-12 NOTE — Progress Notes (Signed)
[facility] [address] [place] [address] Phone: [phone] Subjective:   Bruce Donath, am serving as a scribe for Dr. Antoine Primas. This visit occurred during the SARS-CoV-2 public health emergency.  Safety protocols were in place, including screening questions prior to the visit, additional usage of staff PPE, and extensive cleaning of exam room while observing appropriate contact time as indicated for disinfecting solutions.   I'm seeing this patient by the request  of:  Georgianne Fick, MD  CC: Right thumb pain  [id number]  Laureano Hetzer is a 49 y.o. male coming in with complaint of right thumb pain. Patient states that his thumb pain over IP joint. Has tried to wear a brace for past month. Denies any numbness in hand.  Does not remember any true injury.  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  Patient also does guitar and does race cars that sometimes notices the discomfort and pain.  States that it seems to be more on the dorsal aspect of the thumb.  Points to the IP joint.  Denies any swelling, never had any bruising.       Past Medical History:  Diagnosis Date  . Allergy    seasonal allergies  . GERD (gastroesophageal reflux disease)    patient denies  . Migraines    not often   Past Surgical History:  Procedure Laterality Date  . ABDOMINAL SURGERY  2015   hernia repair with small bowel   . EXPLORATORY LAPAROTOMY  2015   small bowel infarcted with hernia repair  . NASAL SEPTUM SURGERY  2007  . NASAL SINUS SURGERY  2005   2005  . WISDOM TOOTH EXTRACTION     Social History   Socioeconomic History  . Marital status: Married    Spouse name: Not on file  . Number of children: Not on file  . Years of education: Not on file  . Highest education level: Not on file  Occupational History  . Not on file  Tobacco Use  . Smoking status: Never Smoker  . Smokeless tobacco: Never Used   Vaping Use  . Vaping Use: Never used  Substance and Sexual Activity  . Alcohol use: Yes    Alcohol/week: 5.0 standard drinks    Types: 5 Standard drinks or equivalent per week    Comment: 1  a day  . Drug use: No  . Sexual activity: Not on file  Other Topics Concern  . Not on file  Social History Narrative  . Not on file   Social Determinants of Health   Financial Resource Strain: Not on file  Food Insecurity: Not on file  Transportation Needs: Not on file  Physical Activity: Not on file  Stress: Not on file  Social Connections: Not on file   Allergies  Allergen Reactions  . Aspirin Hives   Family History  Problem Relation Age of Onset  . Colon polyps Mother 83  . Diverticulitis Mother   . ALS Father   . Colon cancer Maternal Grandmother 72  . Esophageal cancer Neg Hx   . Inflammatory bowel disease Neg Hx   . Liver disease Neg Hx   . Pancreatic cancer Neg Hx   . Stomach cancer Neg Hx   . Rectal cancer Neg Hx       Current Outpatient Medications (Respiratory):  .  fluticasone (FLONASE) 50 MCG/ACT nasal spray, Place 2 sprays into both nostrils daily as needed.  Current Outpatient Medications (Analgesics):  .  meloxicam (MOBIC) 15 MG tablet, Take 1 tablet (15 mg total) by mouth daily.   Current Outpatient Medications (Other):  .  finasteride (PROSCAR) 5 MG tablet, Take 1.25 mg by mouth 3 (three) times a week. On Monday, Wednesday and Friday. .  Melatonin 3 MG CAPS, Take 3 mg by mouth at bedtime.   Reviewed prior external information including notes and imaging from  primary care provider As well as notes that were available from care everywhere and other healthcare systems.  Past medical history, social, surgical and family history all reviewed in electronic medical record.  No pertanent information unless stated regarding to the chief complaint.   Review of Systems:  No headache, visual changes, nausea, vomiting, diarrhea, constipation, dizziness,  abdominal pain, skin rash, fevers, chills, night sweats, weight loss, swollen lymph nodes, joint swelling, chest pain, shortness of breath, mood changes. POSITIVE muscle aches some body aches  Objective  Blood pressure 104/82, pulse 60, height 5\' 9"  (1.753 m), weight 151 lb (68.5 kg), SpO2 99 %.   General: No apparent distress alert and oriented x3 mood and affect normal, dressed appropriately.  HEENT: Pupils equal, extraocular movements intact  Respiratory: Patient's speak in full sentences and does not appear short of breath  Cardiovascular: No lower extremity edema, non tender, no erythema  Gait normal with good balance and coordination.  MSK: Significant hypermobility of the upper extremities bilaterally.  Beighton score of 8 Right thumb exam does not show any significant abnormality.  Patient does have laxity of the [facility] joint.  This is though consistent to the contralateral side.  Patient neurovascularly intact distally.  Seems to have some mild pain over the dorsal aspect of the IP joint.  No pain over the lateral plates.  No pain over the [facility] joint, negative grind test.  Limited musculoskeletal ultrasound was performed and interpreted by Lyndal Pulley  Limited ultrasound of patient's right thumb shows the patient has some very mild hypoechoic changes of the tendon noted of the extensor tendon just distal to the IP joint.  Mild increase in Doppler flow in the area.  No cortical irregularity of the bone noted.  Mild thinning of the bone potentially.  Trace effusion of the IP joint noted on the flexor side.  No masses appreciated.   Impression and Recommendations:     The above documentation has been reviewed and is accurate and complete Lyndal Pulley, DO

## 2020-09-13 ENCOUNTER — Other Ambulatory Visit: Payer: Self-pay

## 2020-09-13 ENCOUNTER — Ambulatory Visit (INDEPENDENT_AMBULATORY_CARE_PROVIDER_SITE_OTHER): Payer: 59

## 2020-09-13 ENCOUNTER — Encounter: Payer: Self-pay | Admitting: Family Medicine

## 2020-09-13 ENCOUNTER — Ambulatory Visit: Payer: Self-pay

## 2020-09-13 ENCOUNTER — Ambulatory Visit (INDEPENDENT_AMBULATORY_CARE_PROVIDER_SITE_OTHER): Payer: 59 | Admitting: Family Medicine

## 2020-09-13 VITALS — BP 104/82 | HR 60 | Ht 69.0 in | Wt 151.0 lb

## 2020-09-13 DIAGNOSIS — M2550 Pain in unspecified joint: Secondary | ICD-10-CM | POA: Diagnosis not present

## 2020-09-13 DIAGNOSIS — M79644 Pain in right finger(s): Secondary | ICD-10-CM | POA: Diagnosis not present

## 2020-09-13 DIAGNOSIS — M357 Hypermobility syndrome: Secondary | ICD-10-CM | POA: Diagnosis not present

## 2020-09-13 LAB — COMPREHENSIVE METABOLIC PANEL
ALT: 23 U/L (ref 0–53)
AST: 23 U/L (ref 0–37)
Albumin: 4.9 g/dL (ref 3.5–5.2)
Alkaline Phosphatase: 39 U/L (ref 39–117)
BUN: 24 mg/dL — ABNORMAL HIGH (ref 6–23)
CO2: 31 mEq/L (ref 19–32)
Calcium: 9.7 mg/dL (ref 8.4–10.5)
Chloride: 103 mEq/L (ref 96–112)
Creatinine, Ser: 1.11 mg/dL (ref 0.40–1.50)
GFR: 78.53 mL/min (ref >60.00)
Glucose, Bld: 96 mg/dL (ref 70–99)
Potassium: 4.6 mEq/L (ref 3.5–5.1)
Sodium: 138 mEq/L (ref 135–145)
Total Bilirubin: 0.7 mg/dL (ref 0.2–1.2)
Total Protein: 7 g/dL (ref 6.0–8.3)

## 2020-09-13 LAB — CBC WITH DIFFERENTIAL/PLATELET
Basophils Absolute: 0 10*3/uL (ref 0.0–0.1)
Basophils Relative: 0.6 % (ref 0.0–3.0)
Eosinophils Absolute: 0.2 10*3/uL (ref 0.0–0.7)
Eosinophils Relative: 3.2 % (ref 0.0–5.0)
HCT: 44.3 % (ref 39.0–52.0)
Hemoglobin: 15.3 g/dL (ref 13.0–17.0)
Lymphocytes Relative: 33.8 % (ref 12.0–46.0)
Lymphs Abs: 1.9 10*3/uL (ref 0.7–4.0)
MCHC: 34.6 g/dL (ref 30.0–36.0)
MCV: 94 fl (ref 78.0–100.0)
Monocytes Absolute: 0.7 10*3/uL (ref 0.1–1.0)
Monocytes Relative: 12.4 % — ABNORMAL HIGH (ref 3.0–12.0)
Neutro Abs: 2.8 10*3/uL (ref 1.4–7.7)
Neutrophils Relative %: 50 % (ref 43.0–77.0)
Platelets: 181 10*3/uL (ref 150.0–400.0)
RBC: 4.71 Mil/uL (ref 4.22–5.81)
RDW: 13.4 % (ref 11.5–15.5)
WBC: 5.7 10*3/uL (ref 4.0–10.5)

## 2020-09-13 LAB — TESTOSTERONE: Testosterone: 410.62 ng/dL (ref 300.00–890.00)

## 2020-09-13 LAB — SEDIMENTATION RATE: Sed Rate: 2 mm/hr (ref 0–15)

## 2020-09-13 LAB — IBC PANEL
Iron: 132 ug/dL (ref 42–165)
Saturation Ratios: 38 % (ref 20.0–50.0)
Transferrin: 248 mg/dL (ref 212.0–360.0)

## 2020-09-13 LAB — VITAMIN D 25 HYDROXY (VIT D DEFICIENCY, FRACTURES): VITD: 33.25 ng/mL (ref 30.00–100.00)

## 2020-09-13 LAB — URIC ACID: Uric Acid, Serum: 5.1 mg/dL (ref 4.0–7.8)

## 2020-09-13 MED ORDER — MELOXICAM 15 MG PO TABS: 15.0000 mg | ORAL_TABLET | Freq: Every day | ORAL | 0 refills | Status: DC

## 2020-09-13 NOTE — Patient Instructions (Addendum)
Tart cherry extract 1200mg  at night Vit D 2000IU daily Continue voltaren gel Frog splint at night for 2 weeks Could tape daily for support Meloxicam daily for 10 days then as needed Ice 20 min 2x a day Xray today Labs today See me again in 5-6 weeks

## 2020-09-13 NOTE — Assessment & Plan Note (Signed)
Patient does have signs and symptoms consistent with more of a hypermobility.  Discussed icing regimen and home exercises.  Discussed more strengthening then range of motion exercises will get laboratory work-up to rule out other potential causes that can contribute to these chronic tendinopathy's

## 2020-09-13 NOTE — Assessment & Plan Note (Signed)
Patient does have pain in the right thumb.  Has been wearing an immobilizing brace.  Discussed changing it to nighttime.  Pain seems to be more over the IP than the [facility] joint.  UCL appears to be intact but patient does have hypermobility noted.  We discussed with patient about topical anti-inflammatories, given oral anti-inflammatories, laboratory work-up to rule out any other inflammatory conditions that could be contributing.  X-rays are pending.  Hopefully patient will respond to conservative therapy and follow-up with me again in 4 to 8 weeks

## 2020-09-16 LAB — ANTI-NUCLEAR AB-TITER (ANA TITER): ANA Titer 1: 1:40 {titer} — ABNORMAL HIGH

## 2020-09-16 LAB — ANA: Anti Nuclear Antibody (ANA): POSITIVE — AB

## 2020-10-01 ENCOUNTER — Encounter: Payer: Self-pay | Admitting: Family Medicine

## 2020-10-09 ENCOUNTER — Other Ambulatory Visit: Payer: Self-pay | Admitting: Family Medicine

## 2020-10-19 ENCOUNTER — Other Ambulatory Visit: Payer: Self-pay

## 2020-10-19 ENCOUNTER — Ambulatory Visit (INDEPENDENT_AMBULATORY_CARE_PROVIDER_SITE_OTHER): Payer: 59 | Admitting: Family Medicine

## 2020-10-19 ENCOUNTER — Encounter: Payer: Self-pay | Admitting: Family Medicine

## 2020-10-19 ENCOUNTER — Ambulatory Visit: Payer: Self-pay

## 2020-10-19 VITALS — BP 122/84 | HR 57 | Ht 69.0 in | Wt 161.0 lb

## 2020-10-19 DIAGNOSIS — M79644 Pain in right finger(s): Secondary | ICD-10-CM

## 2020-10-19 DIAGNOSIS — G8929 Other chronic pain: Secondary | ICD-10-CM

## 2020-10-19 NOTE — Progress Notes (Signed)
[facility] [name] Phone: [phone] Subjective:   Stephen [name], am serving as a scribe for Dr. Hulan Saas. This visit occurred during the SARS-CoV-2 public health emergency.  Safety protocols were in place, including screening questions prior to the visit, additional usage of staff PPE, and extensive cleaning of exam room while observing appropriate contact time as indicated for disinfecting solutions.   I'm seeing this patient by the request  of:  Merrilee Seashore, MD  CC: Right thumb pain follow-up  [id number]   09/13/2020 Patient does have signs and symptoms consistent with more of a hypermobility.  Discussed icing regimen and home exercises.  Discussed more strengthening then range of motion exercises will get laboratory work-up to rule out other potential causes that can contribute to these chronic tendinopathy's   Update 10/19/2020 Stephen [name] is a 50 y.o. male coming in with complaint of right thumb pain. Pain is improving but does have pain with thumb extension. [name] pain at work. Thumb was numb for one day after doing yard work.  Patient states has not had any of the numbness again.  Patient states that if anything it does seem to make some improvement.  Would state 25 to 30% better.  Has noticed that he can grab a cup without being concerned about it anymore.  Does notice when he is on his phone and seems to be getting a little worse from time to time.  Also has noticed may be guitar and some of the more fun activities he does seems to give him some discomfort.     Past Medical History:  Diagnosis Date  . Allergy    seasonal allergies  . GERD (gastroesophageal reflux disease)    patient denies  . Migraines    not often   Past Surgical History:  Procedure Laterality Date  . ABDOMINAL SURGERY  2015   hernia repair with small bowel   . EXPLORATORY LAPAROTOMY  2015   small bowel infarcted with hernia  repair  . NASAL SEPTUM SURGERY  2007  . NASAL SINUS SURGERY  2005   2005  . WISDOM TOOTH EXTRACTION     Social History   Socioeconomic History  . Marital status: Married    Spouse name: Not on file  . Number of children: Not on file  . Years of education: Not on file  . Highest education level: Not on file  Occupational History  . Not on file  Tobacco Use  . Smoking status: Never Smoker  . Smokeless tobacco: Never Used  Vaping Use  . Vaping Use: Never used  Substance and Sexual Activity  . Alcohol use: Yes    Alcohol/week: 5.0 standard drinks    Types: 5 Standard drinks or equivalent per week    Comment: 1  a day  . Drug use: [name]  . Sexual activity: Not on file  Other Topics Concern  . Not on file  Social History Narrative  . Not on file   Social Determinants of Health   Financial Resource Strain: Not on file  Food Insecurity: Not on file  Transportation Needs: Not on file  Physical Activity: Not on file  Stress: Not on file  Social Connections: Not on file   Allergies  Allergen Reactions  . Aspirin Hives   Family History  Problem Relation Age of Onset  . Colon polyps Mother 6  . Diverticulitis Mother   . ALS Father   . Colon  cancer Maternal Grandmother 65  . Esophageal cancer Neg Hx   . Inflammatory bowel disease Neg Hx   . Liver disease Neg Hx   . Pancreatic cancer Neg Hx   . Stomach cancer Neg Hx   . Rectal cancer Neg Hx       Current Outpatient Medications (Respiratory):  .  fluticasone (FLONASE) 50 MCG/ACT nasal spray, Place 2 sprays into both nostrils daily as needed.   Current Outpatient Medications (Analgesics):  .  meloxicam (MOBIC) 15 MG tablet, TAKE ONE TABLET BY MOUTH DAILY   Current Outpatient Medications (Other):  .  finasteride (PROSCAR) 5 MG tablet, Take 1.25 mg by mouth 3 (three) times a week. On Monday, Wednesday and Friday. .  Melatonin 3 MG CAPS, Take 3 mg by mouth at bedtime.   Reviewed prior external information including  notes and imaging from  primary care provider As well as notes that were available from care everywhere and other healthcare systems.  Past medical history, social, surgical and family history all reviewed in electronic medical record.  [name] pertanent information unless stated regarding to the chief complaint.   Review of Systems:  [name] headache, visual changes, nausea, vomiting, diarrhea, constipation, dizziness, abdominal pain, skin rash, fevers, chills, night sweats, weight loss, swollen lymph nodes, body aches, joint swelling, chest pain, shortness of breath, mood changes. POSITIVE muscle aches  Objective  Blood pressure 122/84, pulse (!) 57, height 5\' 9"  (1.753 m), weight 161 lb (73 kg), SpO2 99 %.   General: [name] apparent distress alert and oriented x3 mood and affect normal, dressed appropriately.  HEENT: Pupils equal, extraocular movements intact  Respiratory: Patient's speak in full sentences and does not appear short of breath  Cardiovascular: [name] lower extremity edema, non tender, [name] erythema  Gait normal with good balance and coordination.  MSK: Right hand exam shows [name] significant abnormality.  Patient's thumb has good range of motion.  Still has significant hypermobility noted.  Patient does have mild laxity of the UCL compared to the contralateral side but [name] true gapping appreciated.  Patient does have pain still with full extension over the IP but not the [facility] joint.  Negative grind test.  Limited musculoskeletal ultrasound was performed and interpreted by Lyndal Pulley  Limited ultrasound of patient's thumb shows that there is [name] hypoechoic changes noted in the IP.  Dieterich is fairly unremarkable.  Mild increase in neovascularization around the UCL but [name] true gapping noted on dynamic view. Impression: Nonspecific findings.    Impression and Recommendations:     The above documentation has been reviewed and is accurate and complete Lyndal Pulley, DO

## 2020-10-19 NOTE — Assessment & Plan Note (Signed)
Patient does have more pain in the right thumb still.  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  On ultrasound today though no significant inflammation noted.  We did discuss the potential small irritation of the UCL.  We discussed though to see this more appropriately possible MRI would be necessary.  Patient does feel 25% better and if necessary would not want to do any type of surgical intervention.  Patient can do bracing and icing if pain starts returning.  Patient would like to follow-up as needed.

## 2020-10-19 NOTE — Patient Instructions (Signed)
Good to see you Meloxicam in 5 day burst Do not use NSAIDS such as Advil or Aleve when taking Meloxicam It is ok to use Tylenol for additional pain relief Don't drive like a race car driver Continue other activities and modifications See me again when you need me

## 2020-11-09 ENCOUNTER — Other Ambulatory Visit: Payer: Self-pay | Admitting: Family Medicine

## 2022-06-20 DIAGNOSIS — D225 Melanocytic nevi of trunk: Secondary | ICD-10-CM | POA: Diagnosis not present

## 2022-06-20 DIAGNOSIS — L578 Other skin changes due to chronic exposure to nonionizing radiation: Secondary | ICD-10-CM | POA: Diagnosis not present

## 2022-06-20 DIAGNOSIS — D235 Other benign neoplasm of skin of trunk: Secondary | ICD-10-CM | POA: Diagnosis not present

## 2022-08-05 DIAGNOSIS — K219 Gastro-esophageal reflux disease without esophagitis: Secondary | ICD-10-CM | POA: Diagnosis not present

## 2022-08-05 DIAGNOSIS — J309 Allergic rhinitis, unspecified: Secondary | ICD-10-CM | POA: Diagnosis not present

## 2022-08-05 DIAGNOSIS — Z0000 Encounter for general adult medical examination without abnormal findings: Secondary | ICD-10-CM | POA: Diagnosis not present

## 2022-08-05 DIAGNOSIS — Z125 Encounter for screening for malignant neoplasm of prostate: Secondary | ICD-10-CM | POA: Diagnosis not present

## 2022-10-22 DIAGNOSIS — D225 Melanocytic nevi of trunk: Secondary | ICD-10-CM | POA: Diagnosis not present

## 2022-10-22 DIAGNOSIS — L820 Inflamed seborrheic keratosis: Secondary | ICD-10-CM | POA: Diagnosis not present

## 2022-10-22 DIAGNOSIS — D235 Other benign neoplasm of skin of trunk: Secondary | ICD-10-CM | POA: Diagnosis not present

## 2022-10-22 DIAGNOSIS — L578 Other skin changes due to chronic exposure to nonionizing radiation: Secondary | ICD-10-CM | POA: Diagnosis not present

## 2022-11-17 DIAGNOSIS — R052 Subacute cough: Secondary | ICD-10-CM | POA: Diagnosis not present

## 2022-11-17 DIAGNOSIS — J3089 Other allergic rhinitis: Secondary | ICD-10-CM | POA: Diagnosis not present

## 2022-11-17 DIAGNOSIS — J301 Allergic rhinitis due to pollen: Secondary | ICD-10-CM | POA: Diagnosis not present

## 2023-06-10 DIAGNOSIS — M533 Sacrococcygeal disorders, not elsewhere classified: Secondary | ICD-10-CM | POA: Diagnosis not present

## 2023-06-10 DIAGNOSIS — M545 Low back pain, unspecified: Secondary | ICD-10-CM | POA: Diagnosis not present

## 2023-06-10 DIAGNOSIS — M1990 Unspecified osteoarthritis, unspecified site: Secondary | ICD-10-CM | POA: Diagnosis not present

## 2023-06-15 DIAGNOSIS — J3089 Other allergic rhinitis: Secondary | ICD-10-CM | POA: Diagnosis not present

## 2023-06-15 DIAGNOSIS — J301 Allergic rhinitis due to pollen: Secondary | ICD-10-CM | POA: Diagnosis not present

## 2023-06-15 DIAGNOSIS — J3081 Allergic rhinitis due to animal (cat) (dog) hair and dander: Secondary | ICD-10-CM | POA: Diagnosis not present

## 2023-06-15 DIAGNOSIS — R052 Subacute cough: Secondary | ICD-10-CM | POA: Diagnosis not present

## 2023-06-22 DIAGNOSIS — R5383 Other fatigue: Secondary | ICD-10-CM | POA: Diagnosis not present

## 2023-06-22 DIAGNOSIS — E291 Testicular hypofunction: Secondary | ICD-10-CM | POA: Diagnosis not present

## 2023-06-23 ENCOUNTER — Other Ambulatory Visit: Payer: Self-pay | Admitting: Internal Medicine

## 2023-06-23 DIAGNOSIS — M461 Sacroiliitis, not elsewhere classified: Secondary | ICD-10-CM

## 2023-06-25 DIAGNOSIS — M546 Pain in thoracic spine: Secondary | ICD-10-CM | POA: Diagnosis not present

## 2023-07-02 DIAGNOSIS — M546 Pain in thoracic spine: Secondary | ICD-10-CM | POA: Diagnosis not present

## 2023-07-06 DIAGNOSIS — M546 Pain in thoracic spine: Secondary | ICD-10-CM | POA: Diagnosis not present

## 2023-07-17 ENCOUNTER — Ambulatory Visit
Admission: RE | Admit: 2023-07-17 | Discharge: 2023-07-17 | Disposition: A | Discharge status: Home / Self Care | Payer: BC Managed Care – PPO | Source: Ambulatory Visit | Attending: Internal Medicine | Admitting: Internal Medicine

## 2023-07-17 DIAGNOSIS — M461 Sacroiliitis, not elsewhere classified: Secondary | ICD-10-CM

## 2023-07-17 DIAGNOSIS — M533 Sacrococcygeal disorders, not elsewhere classified: Secondary | ICD-10-CM | POA: Diagnosis not present

## 2023-07-20 DIAGNOSIS — E291 Testicular hypofunction: Secondary | ICD-10-CM | POA: Diagnosis not present

## 2023-08-08 DIAGNOSIS — M546 Pain in thoracic spine: Secondary | ICD-10-CM | POA: Diagnosis not present

## 2023-08-12 DIAGNOSIS — M546 Pain in thoracic spine: Secondary | ICD-10-CM | POA: Diagnosis not present

## 2023-09-17 DIAGNOSIS — K219 Gastro-esophageal reflux disease without esophagitis: Secondary | ICD-10-CM | POA: Diagnosis not present

## 2023-09-17 DIAGNOSIS — Z125 Encounter for screening for malignant neoplasm of prostate: Secondary | ICD-10-CM | POA: Diagnosis not present

## 2023-09-17 DIAGNOSIS — Z0000 Encounter for general adult medical examination without abnormal findings: Secondary | ICD-10-CM | POA: Diagnosis not present

## 2023-09-23 DIAGNOSIS — J309 Allergic rhinitis, unspecified: Secondary | ICD-10-CM | POA: Diagnosis not present

## 2023-09-23 DIAGNOSIS — K219 Gastro-esophageal reflux disease without esophagitis: Secondary | ICD-10-CM | POA: Diagnosis not present

## 2023-09-23 DIAGNOSIS — G43109 Migraine with aura, not intractable, without status migrainosus: Secondary | ICD-10-CM | POA: Diagnosis not present

## 2023-09-23 DIAGNOSIS — Z0000 Encounter for general adult medical examination without abnormal findings: Secondary | ICD-10-CM | POA: Diagnosis not present

## 2023-10-29 DIAGNOSIS — D225 Melanocytic nevi of trunk: Secondary | ICD-10-CM | POA: Diagnosis not present

## 2023-10-29 DIAGNOSIS — L249 Irritant contact dermatitis, unspecified cause: Secondary | ICD-10-CM | POA: Diagnosis not present

## 2023-10-29 DIAGNOSIS — L578 Other skin changes due to chronic exposure to nonionizing radiation: Secondary | ICD-10-CM | POA: Diagnosis not present

## 2023-10-29 DIAGNOSIS — D235 Other benign neoplasm of skin of trunk: Secondary | ICD-10-CM | POA: Diagnosis not present

## 2024-07-06 DIAGNOSIS — J301 Allergic rhinitis due to pollen: Secondary | ICD-10-CM | POA: Diagnosis not present

## 2024-07-06 DIAGNOSIS — J3089 Other allergic rhinitis: Secondary | ICD-10-CM | POA: Diagnosis not present

## 2024-07-06 DIAGNOSIS — J3081 Allergic rhinitis due to animal (cat) (dog) hair and dander: Secondary | ICD-10-CM | POA: Diagnosis not present

## 2024-07-12 NOTE — Progress Notes (Unsigned)
 [facility] [address] [place] [address] Phone: [phone] Subjective:    I'm seeing this patient by the request  of:  Verdia Lombard, MD  CC: Leg and hip pain  YEP:Dlagzrupcz  Stephen [name] is a 53 y.o. male coming in with complaint of lateral leg pain. Last seen in 2022 for thumb pain. Patient states pain started a couple of months ago. Jump squats. No numbness or tingling. Tylenol helps take the edge off. Pain radiates down to the knee. Does endorse antalgic gait. Pain is increased when sitting. Decreased ROM. Does have other things going on at the hip    Patient did have an MRI of the sacroiliac joints November of last year.  This was independently visualized by me showing no significant bony abnormality.    Past Medical History:  Diagnosis Date   Allergy    seasonal allergies   GERD (gastroesophageal reflux disease)    patient denies   Migraines    not often   Past Surgical History:  Procedure Laterality Date   ABDOMINAL SURGERY  2015   hernia repair with small bowel    EXPLORATORY LAPAROTOMY  2015   small bowel infarcted with hernia repair   NASAL SEPTUM SURGERY  2007   NASAL SINUS SURGERY  2005   2005   WISDOM TOOTH EXTRACTION     Social History   Socioeconomic History   Marital status: Married    Spouse name: Not on file   Number of children: Not on file   Years of education: Not on file   Highest education level: Not on file  Occupational History   Not on file  Tobacco Use   Smoking status: Never   Smokeless tobacco: Never  Vaping Use   Vaping status: Never Used  Substance and Sexual Activity   Alcohol use: Yes    Alcohol/week: 5.0 standard drinks of alcohol    Types: 5 Standard drinks or equivalent per week    Comment: 1  a day   Drug use: No   Sexual activity: Not on file  Other Topics Concern   Not on file  Social History Narrative   Not on file   Social Drivers of Health   Financial Resource  Strain: Not on file  Food Insecurity: Not on file  Transportation Needs: Not on file  Physical Activity: Not on file  Stress: Not on file  Social Connections: Unknown (01/28/2022)   Received from [facility]   Social Network    Social Network: Not on file   Allergies  Allergen Reactions   Aspirin Hives   Family History  Problem Relation Age of Onset   Colon polyps Mother 73   Diverticulitis Mother    ALS Father    Colon cancer Maternal Grandmother 72   Esophageal cancer Neg Hx    Inflammatory bowel disease Neg Hx    Liver disease Neg Hx    Pancreatic cancer Neg Hx    Stomach cancer Neg Hx    Rectal cancer Neg Hx       Current Outpatient Medications (Respiratory):    fluticasone (FLONASE) 50 MCG/ACT nasal spray, Place 2 sprays into both nostrils daily as needed.   Current Outpatient Medications (Analgesics):    meloxicam  (MOBIC ) 15 MG tablet, TAKE ONE TABLET BY MOUTH DAILY   Current Outpatient Medications (Other):    gabapentin (NEURONTIN) 100 MG capsule, Take 2 capsules (200 mg total) by mouth at bedtime.   finasteride (PROSCAR)  5 MG tablet, Take 1.25 mg by mouth 3 (three) times a week. On Monday, Wednesday and Friday.   Melatonin 3 MG CAPS, Take 3 mg by mouth at bedtime.   Reviewed prior external information including notes and imaging from  primary care provider As well as notes that were available from care everywhere and other healthcare systems.  Past medical history, social, surgical and family history all reviewed in electronic medical record.  No pertanent information unless stated regarding to the chief complaint.   Review of Systems:  No headache, visual changes, nausea, vomiting, diarrhea, constipation, dizziness, abdominal pain, skin rash, fevers, chills, night sweats, weight loss, swollen lymph nodes, body aches, joint swelling, chest pain, shortness of breath, mood changes. POSITIVE muscle aches  Objective  Blood pressure 110/82, pulse 63, height 5'  9 (1.753 m), weight 164 lb (74.4 kg), SpO2 99%.   General: No apparent distress alert and oriented x3 mood and affect normal, dressed appropriately.  HEENT: Pupils equal, extraocular movements intact  Respiratory: Patient's speak in full sentences and does not appear short of breath  Cardiovascular: No lower extremity edema, non tender, no erythema   Hip exam shows mild positive FABER test noted.  Patient is severely tender to palpation over the piriformis on the left side.  Negative straight leg test noted.  Relatively good range of motion of the back noted.  Osteopathic findings Sacrum left on left   Impression and Recommendations:     The above documentation has been reviewed and is accurate and complete Stephen [name] M Stephen Lamour, DO

## 2024-07-13 ENCOUNTER — Ambulatory Visit (INDEPENDENT_AMBULATORY_CARE_PROVIDER_SITE_OTHER): Admitting: Family Medicine

## 2024-07-13 ENCOUNTER — Other Ambulatory Visit: Payer: Self-pay

## 2024-07-13 VITALS — BP 110/82 | HR 63 | Ht 69.0 in | Wt 164.0 lb

## 2024-07-13 DIAGNOSIS — M9904 Segmental and somatic dysfunction of sacral region: Secondary | ICD-10-CM | POA: Diagnosis not present

## 2024-07-13 DIAGNOSIS — M7631 Iliotibial band syndrome, right leg: Secondary | ICD-10-CM | POA: Diagnosis not present

## 2024-07-13 DIAGNOSIS — G5702 Lesion of sciatic nerve, left lower limb: Secondary | ICD-10-CM | POA: Insufficient documentation

## 2024-07-13 MED ORDER — GABAPENTIN 100 MG PO CAPS: 200.0000 mg | ORAL_CAPSULE | Freq: Every day | ORAL | 0 refills | Status: DC

## 2024-07-13 NOTE — Assessment & Plan Note (Signed)
 Using [employer]ter's Orthopaedic Anatomy, reviewed with the patient the structures involved and how they related to diagnosis. The patient indicated understanding.   The patient was given a handout about classic piriformis stretching including [employer], Modified [employer], my self-described Sink Stretch, and other piriformis rehab.  We also reviewed hip flexor and abductor strengthening, ham stretching  Rec deep massage, explained self-massage with ball I do think that patient is getting some radicular symptoms and given gabapentin 200 mg as well.  Do think it was respond extremely well.  Did review patient's MRI of the sacroiliac joints that were unremarkable but do feel that patient has had subluxation of this joint previously and did respond fairly well to osteopathic manipulation.

## 2024-07-13 NOTE — Patient Instructions (Signed)
 Piriformis   Tennis ball back left pocket  New balance minimist shoes  Gabapentin 200 mg at night   2 month follow up but keep me updated

## 2024-07-28 ENCOUNTER — Other Ambulatory Visit: Payer: Self-pay | Admitting: Internal Medicine

## 2024-07-28 DIAGNOSIS — K5792 Diverticulitis of intestine, part unspecified, without perforation or abscess without bleeding: Secondary | ICD-10-CM

## 2024-09-06 NOTE — Progress Notes (Unsigned)
" °  [name] [address] [place] [address] Phone: [phone] Subjective:   Stephen [name], am serving as a scribe for Dr. Arthea Claudene.  I'm seeing this patient by the request  of:  Verdia Lombard, MD  CC: back and neck pain follow up   YEP:Dlagzrupcz  [name] is a 53 y.o. male coming in with complaint of back and neck pain. OMT 07/13/2024. Also f/u for L piriformis pain. Patient states that he has not noticed much change since last visit but does feel like we are on the right track. Unsure if manipulation is helpful. Sitting for prolonged periods increases his pain.   Medications patient has been prescribed: Gabapentin   Taking: yes         Reviewed prior external information including notes and imaging from previsou exam, outside providers and external EMR if available.   As well as notes that were available from care everywhere and other healthcare systems.  Past medical history, social, surgical and family history all reviewed in electronic medical record.  No pertanent information unless stated regarding to the chief complaint.   Past Medical History:  Diagnosis Date   Allergy    seasonal allergies   GERD (gastroesophageal reflux disease)    patient denies   Migraines    not often    Allergies[1]   Review of Systems:  No headache, visual changes, nausea, vomiting, diarrhea, constipation, dizziness, abdominal pain, skin rash, fevers, chills, night sweats, weight loss, swollen lymph nodes, body aches, joint swelling, chest pain, shortness of breath, mood changes. POSITIVE muscle aches  Objective  Blood pressure 112/78, pulse 68, height 5' 9 (1.753 m), weight 159 lb (72.1 kg), SpO2 99%.   General: No apparent distress alert and oriented x3 mood and affect normal, dressed appropriately.  HEENT: Pupils equal, extraocular movements intact  Respiratory: Patient's speak in full sentences and does not appear short of  breath  Cardiovascular: No lower extremity edema, non tender, no erythema  Gait normal  MSK:  Back Low back does have loss of lordosis tightness in faber  Patient does have the FABER test on the left side.  Patient though does have more discomfort with a positive straight leg test.  Weakness noted with dorsiflexion of the left foot.  Antalgic gait noted.      Assessment and Plan:  Left lumbar radiculitis Worsening with weakness, affecting ADL's Discussed standing desk  Failed PT, HEP, gabapentin  follow-up again in 6 to 12 weeks       The above documentation has been reviewed and is accurate and complete Buel Molder M Derry Kassel, DO          Note: This dictation was prepared with Dragon dictation along with smaller phrase technology. Any transcriptional errors that result from this process are unintentional.            [1]  Allergies Allergen Reactions   Aspirin Hives   "

## 2024-09-12 ENCOUNTER — Encounter: Payer: Self-pay | Admitting: Family Medicine

## 2024-09-12 ENCOUNTER — Ambulatory Visit

## 2024-09-12 ENCOUNTER — Ambulatory Visit: Admitting: Family Medicine

## 2024-09-12 VITALS — BP 112/78 | HR 68 | Ht 69.0 in | Wt 159.0 lb

## 2024-09-12 DIAGNOSIS — M5416 Radiculopathy, lumbar region: Secondary | ICD-10-CM | POA: Insufficient documentation

## 2024-09-12 DIAGNOSIS — M549 Dorsalgia, unspecified: Secondary | ICD-10-CM | POA: Diagnosis not present

## 2024-09-12 DIAGNOSIS — M545 Low back pain, unspecified: Secondary | ICD-10-CM

## 2024-09-12 DIAGNOSIS — M4187 Other forms of scoliosis, lumbosacral region: Secondary | ICD-10-CM | POA: Diagnosis not present

## 2024-09-12 DIAGNOSIS — M48061 Spinal stenosis, lumbar region without neurogenic claudication: Secondary | ICD-10-CM | POA: Diagnosis not present

## 2024-09-12 DIAGNOSIS — R102 Pelvic and perineal pain unspecified side: Secondary | ICD-10-CM

## 2024-09-12 DIAGNOSIS — M25552 Pain in left hip: Secondary | ICD-10-CM | POA: Diagnosis not present

## 2024-09-12 MED ORDER — METHYLPREDNISOLONE ACETATE 80 MG/ML IJ SUSP
80.0000 mg | Freq: Once | INTRAMUSCULAR | Status: AC
  Administered 2024-09-12: 80 mg via INTRAMUSCULAR

## 2024-09-12 MED ORDER — KETOROLAC TROMETHAMINE 60 MG/2ML IM SOLN
60.0000 mg | Freq: Once | INTRAMUSCULAR | Status: AC
  Administered 2024-09-12: 60 mg via INTRAMUSCULAR

## 2024-09-12 NOTE — Patient Instructions (Addendum)
 Injections in backside Xray lumbar and pelvis MRI lumbar, pelvis Full cocktail injection today. No NSAIDs for 24 hours. We will f/u with you for results

## 2024-09-12 NOTE — Assessment & Plan Note (Addendum)
 Worsening with weakness, affecting ADL's Discussed standing desk  Failed PT, HEP, gabapentin  at this point and is affecting daily activities, patient cannot travel there is nervous to traveling with this.  We discussed icing regimen and home exercises.  At this point I do feel advanced imaging is warranted.  I do think patient may have a partial herniated disc compression of nerves that could respond well to an epidural.  Will get advanced imaging to discuss further deep, Toradol  and Depo-Medrol  injection given

## 2024-09-13 ENCOUNTER — Ambulatory Visit: Payer: Self-pay | Admitting: Family Medicine

## 2024-09-15 MED ORDER — VITAMIN D (ERGOCALCIFEROL) 1.25 MG (50000 UNIT) PO CAPS: 50000.0000 [IU] | ORAL_CAPSULE | ORAL | 0 refills | Status: AC

## 2024-09-20 ENCOUNTER — Encounter: Payer: Self-pay | Admitting: Gastroenterology

## 2024-09-20 ENCOUNTER — Ambulatory Visit: Admitting: Gastroenterology

## 2024-09-20 VITALS — BP 102/50 | HR 54 | Ht 69.0 in | Wt 157.0 lb

## 2024-09-20 DIAGNOSIS — R1013 Epigastric pain: Secondary | ICD-10-CM | POA: Diagnosis not present

## 2024-09-20 DIAGNOSIS — R1012 Left upper quadrant pain: Secondary | ICD-10-CM | POA: Diagnosis not present

## 2024-09-20 NOTE — Patient Instructions (Signed)
 It has been recommended to you by your physician that you have a(n) upper endoscopy completed. Per your request, we did not schedule the procedure(s) today. Please contact our office at [phone] should you decide to have the procedure completed. You will be scheduled for a pre-visit and procedure at that time.

## 2024-09-20 NOTE — Progress Notes (Signed)
 "    09/20/2024 Stephen [name] [id number] 1971-06-23   Discussed the use of AI scribe software for clinical note transcription with the patient, who gave verbal consent to proceed.  History of Present Illness Stephen [name] is a 54 year old male with prior NSAID exposure who presents for evaluation of chronic intermittent upper abdominal pain.  Intermittent, burning upper abdominal pain, mostly left sided, has persisted for approximately two years, localized above the umbilicus and slightly to the left. Pain is described as burning, and is absent between flares.  Initial onset was suspected to be related to NSAID use. A brief trial of Celebrex for hip pain a year ago resulted in recurrence of abdominal pain, prompting discontinuation. Despite cessation of NSAIDs and COX-2 inhibitors for the past year, intermittent pain continues.  Symptoms are food dependent, with coffee identified as the most consistent trigger, especially drinking coffee after lunch. Current intake is limited to a quarter cup of coffee in the morning; decaf does not provoke symptoms. Alcohol intake, typically one drink nightly and occasionally more during vacations, does not exacerbate symptoms. No other clear food triggers identified, though spicy and acidic foods are reduced; tomato-based foods are tolerated.  Over-the-counter omeprazole and famotidine have been trialed daily for several months, sometimes together and sometimes in cycles, but impact is unclear due to concurrent lifestyle modifications. He does not wish to remain on these medications long-term.  Nicotine gum (2 mg/day) is used in the afternoons at work, with uncertain effect on symptoms. He has never smoked, but uses the gum for concentration.  CT scan in November was unremarkable except for significant stool burden. Bowel movements are regular though without constipation. Stool test for H. pylori was negative by PCP. Upper endoscopy twenty years ago for  similar symptoms related to ibuprofen use showed only mild erythema according to his report.  He denies acid reflux, regurgitation, or heartburn. Occasional bloating occurs but is not problematic. No soda or other caffeinated beverages aside from coffee.  During a recent vacation, abstinence from coffee and increased alcohol intake did not result in abdominal pain. Symptoms recurred upon return to work, raising the possibility of a stress component.   Colonoscopy 07/2019: - Hemorrhoids found on digital rectal exam. - The examined portion of the ileum was normal. - One 2 mm polyp in the ascending colon, removed with a cold snare. Resected and retrieved. - Normal mucosa in the entire examined colon otherwise. - Non- bleeding non- thrombosed internal hemorrhoids.  Surgical [P], colon, ascending, polyp - COLONIC MUCOSA WITH SUPERFICIAL HYPERPLASTIC CHANGE, NEGATIVE FOR DYSPLASIA.   10 year recall.  CT scan abdomen and pelvis with contrast 07/2024: No specific evidence of an acute process involving the abdomen or pelvis.   Significant stool burden; clinically correlate for constipation issues.    Past Medical History:  Diagnosis Date   Allergy    seasonal allergies   Colon polyps    GERD (gastroesophageal reflux disease)    patient denies   Migraines    not often   Past Surgical History:  Procedure Laterality Date   ABDOMINAL SURGERY  2015   hernia repair with small bowel    COLONOSCOPY  2021   EXPLORATORY LAPAROTOMY  2015   small bowel infarcted with hernia repair   NASAL SEPTUM SURGERY  2007   NASAL SINUS SURGERY  2005   2005   UMBILICAL HERNIA REPAIR  2015   WISDOM TOOTH EXTRACTION      reports that he has never  smoked. He has never used smokeless tobacco. He reports current alcohol use of about 5.0 standard drinks of alcohol per week. He reports that he does not use drugs. family history includes ALS in his father; Colon cancer (age of onset: 33) in his maternal  grandmother; Colon polyps (age of onset: 78) in his mother; Diverticulitis in his mother. Allergies[1]    Outpatient Encounter Medications as of 09/20/2024  Medication Sig   doxycycline (PERIOSTAT) 20 MG tablet    finasteride (PROSCAR) 5 MG tablet Take 1.25 mg by mouth 3 (three) times a week. On Monday, Wednesday and Friday.   fluticasone (FLONASE) 50 MCG/ACT nasal spray Place 2 sprays into both nostrils daily as needed.    Melatonin 3 MG CAPS Take 3 mg by mouth at bedtime.   minoxidil (LONITEN) 2.5 MG tablet    montelukast (SINGULAIR) 10 MG tablet    Vitamin D , Ergocalciferol , (DRISDOL ) 1.25 MG (50000 UNIT) CAPS capsule Take 1 capsule (50,000 Units total) by mouth every 7 (seven) days.   gabapentin  (NEURONTIN ) 100 MG capsule Take 2 capsules (200 mg total) by mouth at bedtime. (Patient not taking: Reported on 09/20/2024)   [DISCONTINUED] meloxicam  (MOBIC ) 15 MG tablet TAKE ONE TABLET BY MOUTH DAILY   No facility-administered encounter medications on file as of 09/20/2024.    REVIEW OF SYSTEMS  : All other systems reviewed and negative except where noted in the History of Present Illness.   PHYSICAL EXAM: BP (!) 102/50   Pulse (!) 54   Ht 5' 9 (1.753 m)   Wt 157 lb (71.2 kg)   BMI 23.18 kg/m  General: Well developed white male in no acute distress Head: Normocephalic and atraumatic Eyes:  Sclerae anicteric, conjunctiva pink. Ears: Normal auditory acuity Lungs: Clear throughout to auscultation; no W/R/R. Heart: Slightly bradycardic but regular rhythm; no M/R/G. Abdomen: Soft, non-distended.  BS present.  Non-tender. Musculoskeletal: Symmetrical with no gross deformities  Skin: No lesions on visible extremities Extremities: No edema  Neurological: Alert oriented x 4, grossly non-focal Psychological:  Alert and cooperative. Normal mood and affect Assessment & Plan Chronic dyspepsia/LUQ abdominal pain Chronic dyspepsia with persistent symptoms for several years despite cessation of  NSAIDs and lifestyle modifications, without alarm features. Further evaluation is indicated to exclude underlying mucosal pathology and other etiologies. - Scheduled upper endoscopy with Dr, Wilhelmenia to assess for mucosal pathology and alternative causes.  The risks, benefits, and alternatives to EGD were discussed with the patient and he consents to proceed.  - Recommended continuation of omeprazole in the morning and famotidine in the evening until after endoscopy. - Advised avoidance of coffee and other identified dietary triggers.   CC:  Verdia Lombard, MD       [1]  Allergies Allergen Reactions   Aspirin Hives   "

## 2024-09-20 NOTE — Progress Notes (Addendum)
 Attending Physician's Attestation   I have reviewed the chart.   I agree with the Advanced Practitioner's note, impression, and recommendations with any updates as below.    Corliss Parish, MD [facility] Advanced Endoscopy Office # [phone]

## 2024-09-29 ENCOUNTER — Other Ambulatory Visit: Payer: Self-pay | Admitting: Internal Medicine

## 2024-09-29 DIAGNOSIS — M47816 Spondylosis without myelopathy or radiculopathy, lumbar region: Secondary | ICD-10-CM

## 2024-10-04 ENCOUNTER — Telehealth: Payer: Self-pay | Admitting: Gastroenterology

## 2024-10-04 NOTE — Telephone Encounter (Signed)
 PT called back in and scheduled EGD. He needs instructions sent to mychart. Please advise.

## 2024-10-05 NOTE — Telephone Encounter (Signed)
 Instructions sent via Mychart.

## 2024-10-08 ENCOUNTER — Other Ambulatory Visit: Payer: Self-pay | Admitting: Family Medicine

## 2024-10-10 ENCOUNTER — Other Ambulatory Visit

## 2024-10-27 ENCOUNTER — Other Ambulatory Visit

## 2024-12-02 ENCOUNTER — Encounter: Admitting: Gastroenterology
